# Patient Record
Sex: Female | Born: 1975
Health system: Southern US, Community
[De-identification: ages and names within clinical notes are randomized; demographics above are authoritative.]

## PROBLEM LIST (undated history)

## (undated) DIAGNOSIS — R87629 Unspecified abnormal cytological findings in specimens from vagina: Secondary | ICD-10-CM

## (undated) DIAGNOSIS — T7840XA Allergy, unspecified, initial encounter: Secondary | ICD-10-CM

## (undated) DIAGNOSIS — F419 Anxiety disorder, unspecified: Secondary | ICD-10-CM

## (undated) DIAGNOSIS — F17210 Nicotine dependence, cigarettes, uncomplicated: Secondary | ICD-10-CM

## (undated) HISTORY — DX: Nicotine dependence, cigarettes, uncomplicated: F17.210

## (undated) HISTORY — DX: Anxiety disorder, unspecified: F41.9

## (undated) HISTORY — DX: Allergy, unspecified, initial encounter: T78.40XA

## (undated) HISTORY — DX: Unspecified abnormal cytological findings in specimens from vagina: R87.629

---

## 2001-01-03 ENCOUNTER — Other Ambulatory Visit: Admission: RE | Admit: 2001-01-03 | Discharge: 2001-01-03 | Payer: Self-pay | Admitting: *Deleted

## 2001-01-03 ENCOUNTER — Other Ambulatory Visit: Admission: RE | Admit: 2001-01-03 | Discharge: 2001-01-03 | Payer: Self-pay | Admitting: Obstetrics and Gynecology

## 2001-03-13 ENCOUNTER — Other Ambulatory Visit: Admission: RE | Admit: 2001-03-13 | Discharge: 2001-03-13 | Payer: Self-pay | Admitting: Obstetrics and Gynecology

## 2001-03-13 ENCOUNTER — Encounter (INDEPENDENT_AMBULATORY_CARE_PROVIDER_SITE_OTHER): Payer: Self-pay | Admitting: Specialist

## 2001-07-13 ENCOUNTER — Other Ambulatory Visit: Admission: RE | Admit: 2001-07-13 | Discharge: 2001-07-13 | Payer: Self-pay | Admitting: *Deleted

## 2002-06-12 ENCOUNTER — Encounter: Admission: RE | Admit: 2002-06-12 | Discharge: 2002-06-12 | Payer: Self-pay | Admitting: *Deleted

## 2002-06-12 ENCOUNTER — Encounter (INDEPENDENT_AMBULATORY_CARE_PROVIDER_SITE_OTHER): Payer: Self-pay | Admitting: *Deleted

## 2003-11-14 ENCOUNTER — Encounter: Admission: RE | Admit: 2003-11-14 | Discharge: 2003-11-14 | Payer: Self-pay | Admitting: Obstetrics and Gynecology

## 2004-12-03 ENCOUNTER — Ambulatory Visit: Payer: Self-pay | Admitting: Obstetrics and Gynecology

## 2005-11-11 ENCOUNTER — Encounter: Payer: Self-pay | Admitting: Family Medicine

## 2005-11-11 ENCOUNTER — Ambulatory Visit: Payer: Self-pay | Admitting: Obstetrics and Gynecology

## 2006-11-02 ENCOUNTER — Ambulatory Visit: Payer: Self-pay | Admitting: Gynecology

## 2006-11-02 ENCOUNTER — Encounter (INDEPENDENT_AMBULATORY_CARE_PROVIDER_SITE_OTHER): Payer: Self-pay | Admitting: Gynecology

## 2007-12-15 ENCOUNTER — Encounter: Payer: Self-pay | Admitting: Obstetrics & Gynecology

## 2007-12-15 ENCOUNTER — Ambulatory Visit: Payer: Self-pay | Admitting: Obstetrics & Gynecology

## 2008-12-19 ENCOUNTER — Encounter: Payer: Self-pay | Admitting: Obstetrics & Gynecology

## 2008-12-19 ENCOUNTER — Ambulatory Visit: Payer: Self-pay | Admitting: Obstetrics & Gynecology

## 2008-12-20 ENCOUNTER — Encounter: Payer: Self-pay | Admitting: Obstetrics & Gynecology

## 2008-12-20 LAB — CONVERTED CEMR LAB
HCV Ab: NEGATIVE
Hepatitis B Surface Ag: NEGATIVE
Trich, Wet Prep: NONE SEEN

## 2009-12-31 ENCOUNTER — Ambulatory Visit: Payer: Self-pay | Admitting: Obstetrics & Gynecology

## 2011-01-05 NOTE — Group Therapy Note (Signed)
NAMEANGELYN, Angela [name] NO.:  [id number]   MEDICAL RECORD NO.:  [id number]          PATIENT TYPE:  WOC   LOCATION:  [facility]                   FACILITY:  [facility]   PHYSICIAN:  Elsie Lincoln, MD      DATE OF BIRTH:  [birth year]   DATE OF SERVICE:  12/19/2008                                  CLINIC NOTE   HISTORY OF PRESENT ILLNESS:  The patient is a 35 year old, nulliparous  female who presents for annual Pap smear.  She has been in a monogamous  relation 2-1/2 years but still requested STD testing, both vaginally and  serum.  There is are complaints today except for possible beginnings of  a yeast infection, so we will send a wet prep.  The patient does still  smoke and we strongly advised her to quit.  We also talked about  fertility and that she should probably go ahead and consider starting to  try to get pregnant if she ever plans to have one.  We also discussed  genetic abnormalities, increased __________.   PAST MEDICAL HISTORY:  Anxiety.   PAST SURGICAL HISTORY:  None.   GYN HISTORY:  History of ovarian cyst and fibroid tumors.  No sexually  transmitted diseases or Pap smears that are not normal.   SOCIAL HISTORY:  The patient does smoke.  No drinking or drugs.   ALLERGIES:  CODEINE, LATEX.   MEDICATIONS:  Lexapro.   FAMILY HISTORY:  Negative for familial cancers.   REVIEW OF SYSTEMS:  All negative except vaginal discharge.   PHYSICAL EXAMINATION:  VITAL SIGNS:  Temperature 97.9, pulse 87, blood  pressure 92/64, retake 104/64, weight 110 pounds, height 5 feet 3  inches.  GENERAL:  Well-nourished, well-developed in no apparent stress.  HEENT:  Normocephalic, atraumatic.  Thyroid has no masses.  LUNGS:  Clear to auscultation bilaterally.  HEART:  Regular rate and rhythm.  BREASTS:  No masses, no lymphadenopathy.  No skin changes or nipple  discharge.  ABDOMEN:  Soft, nontender.  No organomegaly.  No hernia.  GENITALIA:  Tanner 5.  Vagina pink,  normal rugae with no abnormal  discharge noted.  PELVIC:  Bladder and urethra nontender.  Cervix closed, nontender,  uterus anteverted, nontender.  Adnexa with  no masses, nontender.  RECTAL:  No hemorrhoids.  EXTREMITIES:  Nontender.  No edema.   ASSESSMENT/PLAN:  A 35 year old, nulliparous female for well __________  exam.  1. Pap smear.  2. GC and Chlamydia wet prep.  3. Hepatitis  B and C, RPR, HIV.  4. Return to clinic in a year.           ______________________________  Elsie Lincoln, MD     KL/MEDQ  D:  12/19/2008  T:  12/19/2008  Job:  [id number]

## 2011-01-05 NOTE — Group Therapy Note (Signed)
NAMEDACEY, MILBERGER NO.:  [id number]   MEDICAL RECORD NO.:  [id number]          PATIENT TYPE:  WOC   LOCATION:  [facility]                   FACILITY:  [facility]   PHYSICIAN:  Johnella Moloney, MD        DATE OF BIRTH:  March 20, 1976   DATE OF SERVICE:  12/15/2007                                  CLINIC NOTE   REASON FOR OFFICE VISIT:  Annual exam with STD screening.   HISTORY OF PRESENT ILLNESS:  This is a 35 year old G0, P0, who presents  for her annual exam and STD screening.  She has no major complaints  today.  Her last menstrual period was on November 22, 2007 and was normal.  She has regular menses with only light bleeding and no pain or heavy  bleeding.  She uses condoms for contraception as she is a smoker and she  desires to consider getting pregnant in the near future.  The patient  does report a mild white discharge which is not irritating or causing  any itching.   REVIEW OF SYSTEMS:  The patient denies fever, chills, recent infection,  chest pain, shortness of breath, abdominal pain, nausea, vomiting,  diarrhea, blood in stool or urine.  Anxiety is well controlled on  Lexapro and the patient denies suicidal or homicidal ideation.  She  denies any physical or sexual abuse.   PAST MEDICAL HISTORY:  Significant only for anxiety which is well  controlled on Lexapro 10 mg p.o. daily prescribed by her primary care  physician at [facility].   CURRENT MEDICATIONS:  Lexapro as above.   ALLERGIES:  1. CODEINE.  2. LATEX.   PAST MEDICAL HISTORY:  Reviewed and unchanged.   SOCIAL HISTORY:  Reviewed and unchanged.   FAMILY HISTORY:  Reviewed and unchanged.   PHYSICAL EXAMINATION:  VITAL SIGNS:  Temperature is 98.4, heart rate 83,  blood pressure 100/71, weight 106.5 pounds, height 5 feet 3 inches,  respiratory rate is 16.  GENERAL:  This is a thin, well nourished, well developed female in no  acute distress.  HEART:  Regular rate and rhythm  with no murmurs, rubs, or gallops.  LUNGS:  Clear to auscultation bilaterally with normal work-of-breathing,  and no wheezes, rales, or rhonchi.  ABDOMEN:  Normoactive bowel sounds.  It is soft, nontender, and  nondistended.  EXTREMITIES:  Have no clubbing, cyanosis, or edema and 2 plus dorsalis  pedis pulses bilaterally.  BREASTS:  Have symmetric breast tissue bilaterally without any skin  changes or nipple discharge.  There are no masses or lesions palpated  and no axillary lymphadenopathy.  GENITOURINARY:  The patient has normal external female genitalia without  lesions.  Vaginal mucosa is pink and moist with moderate, thick, white  discharge and wet prep was obtained but no vaginal lesions.  Cervix was  adequately visualized with a Pederson speculum and without discharge,  lesions, or irritation.  Pap smear was obtained and gonorrhea and  chlamydia cultures will be sent from the thin prep.  On bimanual exam  the patient has a nongravid, nontender uterus in the midline position  and  adnexa are nontender and without masses bilaterally.   PENDING LABORATORY:  Pap smear, gonorrhea and chlamydia cultures, HIV,  RPR, hepatitis B and hepatitis C.   ASSESSMENT/PLAN:  This is a 35 year old gravida 0, para 0, healthy  female who presents for her annual exam with sexually transmitted  disease screening.  1. Annual examination.  A Pap smear, bimanual, and breasts exams      performed and Pap smear is pending.  The patient's last Pap smear      was in May 2008 and normal.  2. Contraceptive counseling.  The patient declines the use of oral      contraceptive pills or a Nuva ring given that she is a smoker and      she has had problems with these in the past.  Options such as Depo-      Provera, Merina, and Implanon were discussed with the patient;      however, she is considering the possibility of getting pregnant in      the near future.  She desires to think about these further.  She       will call the office if she desires any of these options.  I      advised the patient to take a Women's Multivitamin with folic acid      daily in the meantime if she is considering the option of getting      pregnant and to continue using lambskin condoms.  3. Sexually transmitted disease testing.  The patient has no known      exposures, however, was screened at her request for gonorrhea,      chlamydia, human immunodeficiency virus, syphilis, hepatitis B and      C.  Results are pending.  4. Anxiety.  The patient is currently stable on Lexapro 10 mg by mouth      daily as prescribed by her primary care physician at [facility].     ______________________________  Dr. Drue Dun    ______________________________  Johnella Moloney, MD    Hadley Pen  D:  12/15/2007  T:  12/15/2007  Job:  [id number]

## 2011-01-08 NOTE — Group Therapy Note (Signed)
NAME:  [name], Angela                        ACCOUNT NO.:  [id number]   MEDICAL RECORD NO.:  [id number]                   PATIENT TYPE:  OUT   LOCATION:  [facility]                           FACILITY:  [facility]   PHYSICIAN:  Argentina Donovan, MD                     DATE OF BIRTH:  [birth year]   DATE OF SERVICE:  11/14/2003                                    CLINIC NOTE   REASON FOR VISIT:  The patient is a 35 year old nulligravida white female  with a history of CIN-1 whose last Pap smear in December 2003 was normal.  She is in for a Pap smear today and we also discussed contraception.  She  had a bad reaction to oral contraceptives many years ago and is resistant to  taking those.  We have discussed the other alternatives and the patient is  going to be tried on the NuvaRing.  She also has a LATEX allergy and is  allergic to CODEINE.  The external genitalia was normal, BUS within normal  limits.  The vagina was clean and well rugated.  The cervix was nulliparous  and clean.  The uterus is anterior, of normal size, shape consistency, with  normal adnexa and a free cul-de-sac.  Abdomen was soft, flat, nontender, no  masses nor organomegaly.                                               Argentina Donovan, MD    PR/MEDQ  D:  11/14/2003  T:  11/14/2003  Job:  [id number]

## 2011-01-14 ENCOUNTER — Ambulatory Visit (INDEPENDENT_AMBULATORY_CARE_PROVIDER_SITE_OTHER): Payer: BC Managed Care – PPO | Admitting: Obstetrics and Gynecology

## 2011-01-14 ENCOUNTER — Other Ambulatory Visit: Payer: Self-pay | Admitting: Obstetrics and Gynecology

## 2011-01-14 DIAGNOSIS — Z01419 Encounter for gynecological examination (general) (routine) without abnormal findings: Secondary | ICD-10-CM

## 2011-01-14 DIAGNOSIS — Z124 Encounter for screening for malignant neoplasm of cervix: Secondary | ICD-10-CM

## 2011-01-15 NOTE — Group Therapy Note (Signed)
NAMEBRADI, ARBUTHNOT NO.:  [id number]  MEDICAL RECORD NO.:  [id number]           PATIENT TYPE:  A  LOCATION:  [facility]                   FACILITY:  [facility]  PHYSICIAN:  Argentina Donovan, MD        DATE OF BIRTH:  1976/04/23  DATE OF SERVICE:  01/14/2011                                 CLINIC NOTE  HISTORY OF PRESENT ILLNESS:  The patient is a 35 year old Caucasian female with no complaints, currently uses condoms for contraception.  We have talked about contraception and I gave her some information written on the Mirena IUD since she is a smoker.  She has allergies to LATEX and CODEINE.  She has had normal Pap smears the last three, I have told her that we look out every 3 years now.  PHYSICAL EXAMINATION:  VITAL SIGNS:  Her blood pressure is 99/65, her weight is 112, and she is 5 feet 3 inches tall. GENERAL:  Well-developed, well-nourished white female in no acute distress. NECK:  Thyroid symmetrical.  No dominant masses. BREASTS:  Symmetrical.  No dominant masses.  No nipple discharge.  No supraclavicular or axillary nodes. ABDOMEN:  Soft, flat, and nontender.  No masses or organomegaly. GENITALIA:  External is normal.  BUS is within normal limits.  Vagina is clean and well rugated.  The cervix is clean and nulliparous.  Uterus is anterior of normal size, shape, and consistency.  The adnexa is normal.  IMPRESSION:  Normal gynecological examination.  The patient to consider Mirena IUD.  Chlamydia and gonorrhea testing will be done after the Pap smear and the patient is having blood tests for hepatitis, Human immunodeficiency virus, and syphilis.          ______________________________ Argentina Donovan, MD    PR/MEDQ  D:  01/14/2011  T:  01/15/2011  Job:  [id number]

## 2011-03-15 ENCOUNTER — Telehealth: Payer: Self-pay | Admitting: *Deleted

## 2011-03-15 NOTE — Telephone Encounter (Signed)
Spoke w/pt. She wanted to know when to start the Loestrin 24 pills. Her LMP was 03/05/11.  I told pt to start on the first Sunday after her next peroid begins. She asked if she can start the pills on the Manual Navarra her period begins. I told her yes.  Pt had no further questions.

## 2011-03-15 NOTE — Telephone Encounter (Signed)
Pt stated she has gotten med filled but is unsure about how and when to start. She said a message could be left on her voice mail.

## 2011-03-16 ENCOUNTER — Telehealth: Payer: Self-pay | Admitting: *Deleted

## 2011-03-16 NOTE — Telephone Encounter (Signed)
No call- encounter started by mistake

## 2011-05-17 ENCOUNTER — Telehealth: Payer: Self-pay | Admitting: *Deleted

## 2011-05-17 NOTE — Telephone Encounter (Signed)
Pt left message stating that she has a question about her Lo estrin and she is having spotting.

## 2011-05-19 NOTE — Telephone Encounter (Signed)
Called pt- left message that I was returning her call to answer her question and discuss her concern.

## 2011-05-19 NOTE — Telephone Encounter (Signed)
Spoke w/pt. She states she is on her second pack of Loestrin. She had some light breakthrough bleeding mid-cycle during the first pack which lasted a couple days. Now during this second pack she has been bleeding for 1 wk and it is heavier than last month. She is currently in the third week of pills of the pack. She wants to know if this is normal or expected. I stated that it is common for your body to take several months to be accustomed to the pills but that the breakthrough bleeding should not increase. I will send a message to Dr. Okey Dupre to see if he would like to change her Rx. We will call her back next week after hearing from Dr. Okey Dupre. Pt voiced understanding.

## 2011-05-25 NOTE — Telephone Encounter (Signed)
Pt left new message checking to see what Dr. Okey Dupre wanted to do about her bleeding. I called her back and learned that the bleeding has become much less- only occasional spotting. I told pt that I have not  heard back from Dr. Okey Dupre as of yet. I will be able to speak to him tomorrow afternoon. Pt voiced understanding.

## 2011-05-26 NOTE — Telephone Encounter (Signed)
Called pt after speaking w/Dr. Okey Dupre.  I informed her that he said to continue her pills as prescribed. If she has bleeding mid-cycle during the next pack which is heavier that spotting, she should call for follow up appt as we may need to change her Rx. Pt voiced understanding.

## 2011-06-09 ENCOUNTER — Telehealth: Payer: Self-pay | Admitting: *Deleted

## 2011-06-09 NOTE — Telephone Encounter (Signed)
Pt left message stating that she received my message and is comfortable with that plan of care. She will call back if next period is not normal or if she has further questions.

## 2011-06-09 NOTE — Telephone Encounter (Signed)
Pt left message stating that she did not have her normal cycle with the last pack of pills (Loestrin).  She is 10 days into the next pack and still has not had a period. She would like a call back. I returned her call and left message on her personal voice mail. I stated that since last month (9/24)  she had an unusually heavy and long lasting period, this is probably just her body becoming used to the hormones. She should continue to take the pills according to schedule. If she does not get a period with this pack or if the period is unusually heavy, she should call back for follow up appt. Also she may call back if she has further questions.

## 2011-07-22 ENCOUNTER — Telehealth: Payer: Self-pay | Admitting: *Deleted

## 2011-07-22 NOTE — Telephone Encounter (Signed)
Pt left message stating that she has not had a cycle this month. She has been taking Loestrin without interruption and has 3 pills left of the current pack. She has checked a home UPT and it is negative. She would like to speak to a nurse before starting the next pack of pills.

## 2011-07-23 NOTE — Telephone Encounter (Signed)
Spoke w/pt and discussed her concerns. She is now on her 3rd or 4th pack of Lo estrin and has not had a full period since beginning the medication. She reports that she has had some spotting @ times. She is due to start her next pack of pills in 2 days and wants to know what to do. I advised pt to continue the Lo estrin and we will get her an appt for follow up before the pack is finished. I stated that it is ok for her not to have a period for several months but the doctor may not want her to stay on these pills indefinitely. Since the pt has not been seen since starting the OCP's, I asked if she has had her BP checked. She stated that she was seen by another provider recently due to illness and her BP was about the same as on her last clinic visit (99/65). Pt will be contacted with appt once it is scheduled. Pt voiced understanding.

## 2011-08-06 ENCOUNTER — Encounter: Payer: Self-pay | Admitting: Physician Assistant

## 2011-08-06 ENCOUNTER — Ambulatory Visit (INDEPENDENT_AMBULATORY_CARE_PROVIDER_SITE_OTHER): Payer: BC Managed Care – PPO | Admitting: Physician Assistant

## 2011-08-06 VITALS — BP 104/71 | HR 72 | Temp 98.6°F | Ht 63.0 in | Wt 117.7 lb

## 2011-08-06 DIAGNOSIS — F17210 Nicotine dependence, cigarettes, uncomplicated: Secondary | ICD-10-CM

## 2011-08-06 DIAGNOSIS — F17200 Nicotine dependence, unspecified, uncomplicated: Secondary | ICD-10-CM

## 2011-08-06 DIAGNOSIS — N920 Excessive and frequent menstruation with regular cycle: Secondary | ICD-10-CM

## 2011-08-06 HISTORY — DX: Nicotine dependence, cigarettes, uncomplicated: F17.210

## 2011-08-06 NOTE — Patient Instructions (Signed)
Smoking Cessation This document explains the best ways for you to quit smoking and new treatments to help. It lists new medicines that can double or triple your chances of quitting and quitting for good. It also considers ways to avoid relapses and concerns you may have about quitting, including weight gain. NICOTINE: A POWERFUL ADDICTION If you have tried to quit smoking, you know how hard it can be. It is hard because nicotine is a very addictive drug. For some people, it can be as addictive as heroin or cocaine. Usually, people make 2 or 3 tries, or more, before finally being able to quit. Each time you try to quit, you can learn about what helps and what hurts. Quitting takes hard work and a lot of effort, but you can quit smoking. QUITTING SMOKING IS ONE OF THE MOST IMPORTANT THINGS YOU WILL EVER DO.  You will live longer, feel better, and live better.   The impact on your body of quitting smoking is felt almost immediately:   Within 20 minutes, blood pressure decreases. Pulse returns to its normal level.   After 8 hours, carbon monoxide levels in the blood return to normal. Oxygen level increases.   After 24 hours, chance of heart attack starts to decrease. Breath, hair, and body stop smelling like smoke.   After 48 hours, damaged nerve endings begin to recover. Sense of taste and smell improve.   After 72 hours, the body is virtually free of nicotine. Bronchial tubes relax and breathing becomes easier.   After 2 to 12 weeks, lungs can hold more air. Exercise becomes easier and circulation improves.   Quitting will reduce your risk of having a heart attack, stroke, cancer, or lung disease:   After 1 year, the risk of coronary heart disease is cut in half.   After 5 years, the risk of stroke falls to the same as a nonsmoker.   After 10 years, the risk of lung cancer is cut in half and the risk of other cancers decreases significantly.   After 15 years, the risk of coronary heart  disease drops, usually to the level of a nonsmoker.   If you are pregnant, quitting smoking will improve your chances of having a healthy baby.   The people you live with, especially your children, will be healthier.   You will have extra money to spend on things other than cigarettes.  FIVE KEYS TO QUITTING Studies have shown that these 5 steps will help you quit smoking and quit for good. You have the best chances of quitting if you use them together: 1. Get ready.  2. Get support and encouragement.  3. Learn new skills and behaviors.  4. Get medicine to reduce your nicotine addiction and use it correctly.  5. Be prepared for relapse or difficult situations. Be determined to continue trying to quit, even if you do not succeed at first.  1. GET READY  Set a quit date.   Change your environment.   Get rid of ALL cigarettes, ashtrays, matches, and lighters in your home, car, and place of work.   Do not let people smoke in your home.   Review your past attempts to quit. Think about what worked and what did not.   Once you quit, do not smoke. NOT EVEN A PUFF!  2. GET SUPPORT AND ENCOURAGEMENT Studies have shown that you have a better chance of being successful if you have help. You can get support in many ways.  Tell   your family, friends, and coworkers that you are going to quit and need their support. Ask them not to smoke around you.   Talk to your caregivers (doctor, dentist, nurse, pharmacist, psychologist, and/or smoking counselor).   Get individual, group, or telephone counseling and support. The more counseling you have, the better your chances are of quitting. Programs are available at local hospitals and health centers. Call your local health department for information about programs in your area.   Spiritual beliefs and practices may help some smokers quit.   Quit meters are small computer programs online or downloadable that keep track of quit statistics, such as amount  of "quit-time," cigarettes not smoked, and money saved.   Many smokers find one or more of the many self-help books available useful in helping them quit and stay off tobacco.  3. LEARN NEW SKILLS AND BEHAVIORS  Try to distract yourself from urges to smoke. Talk to someone, go for a walk, or occupy your time with a task.   When you first try to quit, change your routine. Take a different route to work. Drink tea instead of coffee. Eat breakfast in a different place.   Do something to reduce your stress. Take a hot bath, exercise, or read a book.   Plan something enjoyable to do every day. Reward yourself for not smoking.   Explore interactive web-based programs that specialize in helping you quit.  4. GET MEDICINE AND USE IT CORRECTLY Medicines can help you stop smoking and decrease the urge to smoke. Combining medicine with the above behavioral methods and support can quadruple your chances of successfully quitting smoking. The U[place]. Food and Drug Administration (FDA) has approved 7 medicines to help you quit smoking. These medicines fall into 3 categories.  Nicotine replacement therapy (delivers nicotine to your body without the negative effects and risks of smoking):   Nicotine gum: Available over-the-counter.   Nicotine lozenges: Available over-the-counter.   Nicotine inhaler: Available by prescription.   Nicotine nasal spray: Available by prescription.   Nicotine skin patches (transdermal): Available by prescription and over-the-counter.   Antidepressant medicine (helps people abstain from smoking, but how this works is unknown):   Bupropion sustained-release (SR) tablets: Available by prescription.   Nicotinic receptor partial agonist (simulates the effect of nicotine in your brain):   Varenicline tartrate tablets: Available by prescription.   Ask your caregiver for advice about which medicines to use and how to use them. Carefully read the information on the package.    Everyone who is trying to quit may benefit from using a medicine. If you are pregnant or trying to become pregnant, nursing an infant, you are under age 18, or you smoke fewer than 10 cigarettes per day, talk to your caregiver before taking any nicotine replacement medicines.   You should stop using a nicotine replacement product and call your caregiver if you experience nausea, dizziness, weakness, vomiting, fast or irregular heartbeat, mouth problems with the lozenge or gum, or redness or swelling of the skin around the patch that does not go away.   Do not use any other product containing nicotine while using a nicotine replacement product.   Talk to your caregiver before using these products if you have diabetes, heart disease, asthma, stomach ulcers, you had a recent heart attack, you have high blood pressure that is not controlled with medicine, a history of irregular heartbeat, or you have been prescribed medicine to help you quit smoking.  5. BE PREPARED FOR RELAPSE OR   DIFFICULT SITUATIONS  Most relapses occur within the first 3 months after quitting. Do not be discouraged if you start smoking again. Remember, most people try several times before they finally quit.   You may have symptoms of withdrawal because your body is used to nicotine. You may crave cigarettes, be irritable, feel very hungry, cough often, get headaches, or have difficulty concentrating.   The withdrawal symptoms are only temporary. They are strongest when you first quit, but they will go away within 10 to 14 days.  Here are some difficult situations to watch for:  Alcohol. Avoid drinking alcohol. Drinking lowers your chances of successfully quitting.   Caffeine. Try to reduce the amount of caffeine you consume. It also lowers your chances of successfully quitting.   Other smokers. Being around smoking can make you want to smoke. Avoid smokers.   Weight gain. Many smokers will gain weight when they quit, usually  less than 10 pounds. Eat a healthy diet and stay active. Do not let weight gain distract you from your main goal, quitting smoking. Some medicines that help you quit smoking may also help delay weight gain. You can always lose the weight gained after you quit.   Bad mood or depression. There are a lot of ways to improve your mood other than smoking.  If you are having problems with any of these situations, talk to your caregiver. SPECIAL SITUATIONS AND CONDITIONS Studies suggest that everyone can quit smoking. Your situation or condition can give you a special reason to quit.  Pregnant women/new mothers: By quitting, you protect your baby's health and your own.   Hospitalized patients: By quitting, you reduce health problems and help healing.   Heart attack patients: By quitting, you reduce your risk of a second heart attack.   Lung, head, and neck cancer patients: By quitting, you reduce your chance of a second cancer.   Parents of children and adolescents: By quitting, you protect your children from illnesses caused by secondhand smoke.  QUESTIONS TO THINK ABOUT Think about the following questions before you try to stop smoking. You may want to talk about your answers with your caregiver.  Why do you want to quit?   If you tried to quit in the past, what helped and what did not?   What will be the most difficult situations for you after you quit? How will you plan to handle them?   Who can help you through the tough times? Your family? Friends? Caregiver?   What pleasures do you get from smoking? What ways can you still get pleasure if you quit?  Here are some questions to ask your caregiver:  How can you help me to be successful at quitting?   What medicine do you think would be best for me and how should I take it?   What should I do if I need more help?   What is smoking withdrawal like? How can I get information on withdrawal?  Quitting takes hard work and a lot of effort,  but you can quit smoking. FOR MORE INFORMATION  Smokefree.gov (http://www.smokefree.gov) provides free, accurate, evidence-based information and professional assistance to help support the immediate and long-term needs of people trying to quit smoking. Document Released: 08/03/2001 Document Revised: 04/21/2011 Document Reviewed: 05/26/2009 ExitCare Patient Information 2012 [employer]. 

## 2011-08-06 NOTE — Progress Notes (Signed)
Chief Complaint:  Follow-up   Angela [name] is  35 y.o. No obstetric history on file..  Patient's last menstrual period was 07/19/2011..  She presents complaining of Follow-up  States irregular bleeding since starting OCPs. Describes missing periods and having breakthrough spotting mid-cycle. Using OCPs for contraception, states periods regular, light, and short when not on hormones. Undesired fertility at this time, unsure of desire for future fertility at this time.  Denies lower extremity pain or swelling, chest pain, abd pain, dysuria, dyspareunia.   Obstetrical/Gynecological History: OB History    Grav Para Term Preterm Abortions TAB SAB Ect Mult Living                  Past Medical History: Past Medical History  Diagnosis Date  . Anxiety     on lexapro  . Allergy     latex and codeine  . Heavy smoker (more than 20 cigarettes per day) 08/06/2011    Past Surgical History: History reviewed. No pertinent past surgical history.  Family History: Family History  Problem Relation Age of Onset  . Cancer Maternal Grandmother     throat  . Cancer Maternal Grandfather     prostate    Social History: History  Substance Use Topics  . Smoking status: Current Everyday Smoker -- 1.0 packs/day for 15 years    Types: Cigarettes  . Smokeless tobacco: Never Used  . Alcohol Use: Yes     rarely    Allergies:  Allergies  Allergen Reactions  . Latex   . Codeine       Review of Systems - Negative except what has been reviewed in the HPI  Physical Exam   Blood pressure 104/71, pulse 72, temperature 98.6 F (37 C), temperature source Oral, height 5\' 3"  (1.6 m), weight 53.388 kg (117 lb 11.2 oz), last menstrual period 07/19/2011.  General: General appearance - alert, well appearing, and in no distress and oriented to person, place, and time Mental status - alert, oriented to person, place, and time, normal mood, behavior, speech, dress, motor activity, and thought  processes, affect appropriate to mood Focused Gynecological Exam: examination not indicated   Assessment: Contraceptive Management  Patient Active Problem List  Diagnoses  . Heavy smoker (more than 20 cigarettes per day)    Plan: Discussed with patient contraindication of OCPs given 1ppd smoker, will not refill OCP rx. Review options for non hormonal and progestin only contraception.   Smoking cessation strongly encouraged  Condoms/spermicide recommended in meantime.    Bethenny Losee E. 08/06/2011,9:42 AM

## 2011-09-14 ENCOUNTER — Telehealth: Payer: Self-pay | Admitting: *Deleted

## 2011-09-14 NOTE — Telephone Encounter (Signed)
Pt left message to give update on her status as requested by Maylon Cos. She states that she has finally had a normal period after 5 packs of pills. She does not want IUD @ this time since she has had a normal menstrual cycle. Report of call routed to [facility] for review.

## 2011-09-17 MED ORDER — NORETHINDRONE 0.35 MG PO TABS: 1.0000 | ORAL_TABLET | Freq: Every day | ORAL | Status: DC

## 2011-09-17 NOTE — Telephone Encounter (Signed)
Received call from Rosalita Chessman in response to the message I had sent.  She asked me to call pt and remind her of the strong recommendation that she not continue to take OCP's due to her age and smoking status. Pt voiced understanding of the risks involved. She stated that she does not want the Mirena IUD @ this time. She asked if she can take the OCP which does not contain estrogen. I called Maylon Cos and Rx for Micronor was approved.  Pt was informed and will begin new Rx on 09/19/11. Pt was also advised that Rosalita Chessman wants her to have follow up in 6 mos. Pt voiced understanding of all information and instructions.

## 2012-01-13 ENCOUNTER — Telehealth: Payer: Self-pay | Admitting: *Deleted

## 2012-01-13 NOTE — Telephone Encounter (Signed)
Patient called stating wanted to know if she needed to have a follow up appointment and discuss birth control pills. Patient also states having brown discharge with odor.

## 2012-01-13 NOTE — Telephone Encounter (Signed)
Called Angela [name], states she is on [name] birth control pill and it is causing her period to be prolonged-with prolonged bleeding for 2 weeks. Also c/o brown vaginal discharge with odor and states she is not sure if it is period type bleeding or vaginal discharge.  Discussed should have it evaluated and can disuss changing pill also, also wants urine checked. Transferred  To front desk to make appointments

## 2012-01-14 ENCOUNTER — Encounter: Payer: Self-pay | Admitting: Physician Assistant

## 2012-01-14 ENCOUNTER — Ambulatory Visit (INDEPENDENT_AMBULATORY_CARE_PROVIDER_SITE_OTHER): Payer: BC Managed Care – PPO | Admitting: Physician Assistant

## 2012-01-14 VITALS — BP 103/72 | HR 81 | Temp 97.9°F | Resp 12 | Ht 63.0 in | Wt 114.4 lb

## 2012-01-14 DIAGNOSIS — N760 Acute vaginitis: Secondary | ICD-10-CM

## 2012-01-14 DIAGNOSIS — R300 Dysuria: Secondary | ICD-10-CM

## 2012-01-14 LAB — POCT URINALYSIS DIP (DEVICE)
Ketones, ur: NEGATIVE mg/dL
Leukocytes, UA: NEGATIVE
Protein, ur: NEGATIVE mg/dL
Specific Gravity, Urine: 1.01 (ref 1.005–1.030)
pH: 5.5 (ref 5.0–8.0)

## 2012-01-14 LAB — WET PREP, GENITAL: Trich, Wet Prep: NONE SEEN

## 2012-01-14 MED ORDER — TINIDAZOLE 500 MG PO TABS: 2.0000 g | ORAL_TABLET | Freq: Every day | ORAL | Status: AC

## 2012-01-14 NOTE — Patient Instructions (Signed)
Vaginitis Vaginitis is an infection. It causes soreness, swelling, and redness (inflammation) of the vagina. Many of these infections are sexually transmitted diseases (STDs). Having unprotected sex can cause further problems and complications such as:  Chronic pelvic pain.   Infertility.   Unwanted pregnancy.   Abortion.   Tubal pregnancy.   Infection passed on to the newborn.   Cancer.  CAUSES   Monilia. This is a yeast or fungus infection, not an STD.   Bacterial vaginosis. The normal balance of bacteria in the vagina is disrupted and is replaced by an overgrowth of certain bacteria.   Gonorrhea, chlamydia. These are bacterial infections that are STDs.   Vaginal sponges, diaphragms, and intrauterine devices.   Trichomoniasis. This is a STD infection caused by a parasite.   Viruses like herpes and human papillomavirus. Both are STDs.   Pregnancy.   Immunosuppression. This occurs with certain conditions such as HIV infection or cancer.   Using bubble bath.   Taking certain antibiotic medicines.   Sporadic recurrence can occur if you become sick.   Diabetes.   Steroids.   Allergic reaction. If you have an allergy to:   Douches.   Soaps.   Spermicides.   Condoms.   Scented tampons or vaginal sprays.  SYMPTOMS   Abnormal vaginal discharge.   Itching of the vagina.   Pain in the vagina.   Swelling of the vagina.  In some cases, there are no symptoms. TREATMENT  Treatment will vary depending on the type of infection.  Bacteria or trichomonas are usually treated with oral antibiotics and sometimes vaginal cream or suppositories.   Monilia vaginitis is usually treated with vaginal creams, suppositories, or oral antifungal pills.   Viral vaginitis has no cure. However, the symptoms of herpes (a viral vaginitis) can be treated to relieve the discomfort. Human papillomavirus has no symptoms. However, there are treatments for the diseases caused by human  papillomavirus.   With allergic vaginitis, you need to stop using the product that is causing the problem. Vaginal creams can be used to treat the symptoms.   When treating an STD, the sex partner should also be treated.  HOME CARE INSTRUCTIONS   Take all the medicines as directed by your caregiver.   Do not use scented tampons, soaps, or vaginal sprays.   Do not douche.   Tell your sex partner if you have a vaginal infection or an STD.   Do not have sexual intercourse until you have treated the vaginitis.   Practice safe sex by using condoms.  SEEK MEDICAL CARE IF:   You have abdominal pain.   Your symptoms get worse during treatment.  Document Released: 06/06/2007 Document Revised: 07/29/2011 Document Reviewed: 01/30/2009 ExitCare Patient Information 2012 [employer]. 

## 2012-01-14 NOTE — Progress Notes (Signed)
Chief Complaint:  Vaginal Discharge and Contraception   Angela [name] is  36 y.o. No obstetric history on file..  Patient's last menstrual period was 12/16/2011..  She presents complaining of Vaginal Discharge and Contraception  White/brown discharge with odor, worse after intercourse for several weeks.  Obstetrical/Gynecological History: OB History    Grav Para Term Preterm Abortions TAB SAB Ect Mult Living                  Past Medical History: Past Medical History  Diagnosis Date  . Anxiety     on lexapro  . Allergy     latex and codeine  . Heavy smoker (more than 20 cigarettes per day) 08/06/2011    Past Surgical History: No past surgical history on file.  Family History: Family History  Problem Relation Age of Onset  . Cancer Maternal Grandmother     throat  . Cancer Maternal Grandfather     prostate    Social History: History  Substance Use Topics  . Smoking status: Current Everyday Smoker -- 1.0 packs/day for 15 years    Types: Cigarettes  . Smokeless tobacco: Never Used  . Alcohol Use: Yes     rarely    Allergies:  Allergies  Allergen Reactions  . Latex   . Codeine      (Not in a hospital admission)  Review of Systems - Negative except what has been reviewed in HPI  Physical Exam   Blood pressure 103/72, pulse 81, temperature 97.9 F (36.6 C), temperature source Oral, resp. rate 12, height 5\' 3"  (1.6 m), weight 114 lb 6.4 oz (51.891 kg), last menstrual period 12/16/2011.  General: General appearance - alert, well appearing, and in no distress and oriented to person, place, and time Mental status - alert, oriented to person, place, and time, normal mood, behavior, speech, dress, motor activity, and thought processes, affect appropriate to mood Abdomen - soft, nontender, nondistended, no masses or organomegaly Focused Gynecological Exam: VULVA: normal appearing vulva with no masses, tenderness or lesions, VAGINA: retained tampon, malodorous  removed from vault, CERVIX: normal appearing cervix without discharge or lesions, UTERUS: uterus is normal size, shape, consistency and nontender, ADNEXA: normal adnexa in size, nontender and no masses  Labs: Recent Results (from the past 24 hour(s))  POCT URINALYSIS DIP (DEVICE)   Collection Time   01/14/12  9:40 AM      Component Value Range   Glucose, UA NEGATIVE  NEGATIVE (mg/dL)   Bilirubin Urine NEGATIVE  NEGATIVE    Ketones, ur NEGATIVE  NEGATIVE (mg/dL)   Specific Gravity, Urine 1.010  1.005 - 1.030    Hgb urine dipstick TRACE (*) NEGATIVE    pH 5.5  5.0 - 8.0    Protein, ur NEGATIVE  NEGATIVE (mg/dL)   Urobilinogen, UA 0.2  0.0 - 1.0 (mg/dL)   Nitrite NEGATIVE  NEGATIVE    Leukocytes, UA NEGATIVE  NEGATIVE     Assessment: Retained tampon Vaginitis  Plan: Will send rx for tindamax Urine culture pending  Angela [name] E. 01/14/2012,10:02 AM

## 2012-01-20 ENCOUNTER — Telehealth: Payer: Self-pay | Admitting: *Deleted

## 2012-01-20 NOTE — Telephone Encounter (Signed)
Received a call transferred from front desk, patient requesting urine culture results. Informed patient urine culture negative.. No other concerns voiced

## 2012-04-01 ENCOUNTER — Other Ambulatory Visit: Payer: Self-pay | Admitting: Physician Assistant

## 2012-04-03 ENCOUNTER — Other Ambulatory Visit: Payer: Self-pay | Admitting: Medical

## 2012-04-03 DIAGNOSIS — Z3041 Encounter for surveillance of contraceptive pills: Secondary | ICD-10-CM

## 2012-04-03 MED ORDER — NORETHINDRONE 0.35 MG PO TABS: 1.0000 | ORAL_TABLET | Freq: Every day | ORAL | Status: DC

## 2012-04-03 NOTE — Telephone Encounter (Signed)
Pt called requesting refill for OCPs be sent to [facility] pharmacy on [facility] [address] [facility] [address]. Per protocol 3 months of OCPs will be sent to her pharmacy. Order put in and routed to Maylon Cos, CNM for signature.

## 2012-04-25 ENCOUNTER — Telehealth: Payer: Self-pay | Admitting: Medical

## 2012-04-25 NOTE — Telephone Encounter (Signed)
Patient called stating that she is on OCPs and would like to be seen before her next cycle starts around the 7th. She is experiencing a d/c with odor similar to previous d/c. Would like to talk to someone and hopefully come in soon.

## 2012-04-25 NOTE — Telephone Encounter (Signed)
LM for patient to return call to clinic to discuss her earlier message. Patient is describing BV symptoms, but has been treated within the last 6 months so she will need an appointment. She wants to come in to talk to a provider about her cycles anyway. Next available GYN appointment is not until 05/05/12. Will need to ask Tresa Endo to [facility] depending on when patient is available.

## 2012-04-25 NOTE — Telephone Encounter (Signed)
Patient returned call to clinic. Discussed bleeding and d/c concerns. Patient is experiencing long, heavy periods on the Erin OCPs and has had a brown foul smelling discharge recently. I have made her an appointment to come in on Friday to be seen in the clinic. The patient plans to keep this appointment. She voiced understanding and no further questions.

## 2012-04-28 ENCOUNTER — Ambulatory Visit (INDEPENDENT_AMBULATORY_CARE_PROVIDER_SITE_OTHER): Payer: BC Managed Care – PPO | Admitting: Obstetrics & Gynecology

## 2012-04-28 ENCOUNTER — Encounter: Payer: Self-pay | Admitting: Obstetrics & Gynecology

## 2012-04-28 VITALS — BP 108/73 | HR 89 | Temp 97.2°F | Ht 63.0 in | Wt 116.6 lb

## 2012-04-28 DIAGNOSIS — N760 Acute vaginitis: Secondary | ICD-10-CM

## 2012-04-28 DIAGNOSIS — A499 Bacterial infection, unspecified: Secondary | ICD-10-CM

## 2012-04-28 DIAGNOSIS — N939 Abnormal uterine and vaginal bleeding, unspecified: Secondary | ICD-10-CM

## 2012-04-28 DIAGNOSIS — N898 Other specified noninflammatory disorders of vagina: Secondary | ICD-10-CM

## 2012-04-28 NOTE — Progress Notes (Signed)
Subjective:     Patient ID: ODENA MCQUAID, female   DOB: [birth year], 36 y.o.   MRN: [id number]  HPI  Pt on progesterone only contraception.  She c/o cont'd spotting. She feels like she always has an odor since she had a retained tampon recently.  Uses powder, medicated wipes, occ douching and scented soap.     Review of Systems n/c     Objective:   Physical ExamBP 108/73  Pulse 89  Temp 97.2 F (36.2 C) (Oral)  Ht 5\' 3"  (1.6 m)  Wt 116 lb 9.6 oz (52.889 kg)  BMI 20.65 kg/m2  LMP 03/29/2012  GU: EGBUS: no lesions noted  Vagina: small amount of  blood in vault Cervix: no lesion; no mucopurulent d/c  Current outpatient prescriptions:escitalopram (LEXAPRO) 10 MG tablet, Take 10 mg by mouth daily.  , Disp: , Rfl: ;  norethindrone (ORTHO MICRONOR) 0.35 MG tablet, Take 1 tablet (0.35 mg total) by mouth daily., Disp: 1 Package, Rfl: 3     Assessment:     Abnormal uterine bleeding on Progesterone only pills.  D/W pt other options for Progesterone only contraception including Nexplanon and Mirena IUS.  Pt does not want to try those options at present.    Wet mount      Plan:     D/w pt bleeding issues with progesterone only productsn/c   Avoid all scented feminine products Avoid douching F/u wet mount F/u prn  Haili Donofrio L. Harraway-Smith, M.D., [facility]

## 2012-04-28 NOTE — Patient Instructions (Addendum)
Bacterial Vaginosis Bacterial vaginosis (BV) is a vaginal infection where the normal balance of bacteria in the vagina is disrupted. The normal balance is then replaced by an overgrowth of certain bacteria. There are several different kinds of bacteria that can cause BV. BV is the most common vaginal infection in women of childbearing age. CAUSES   The cause of BV is not fully understood. BV develops when there is an increase or imbalance of harmful bacteria.   Some activities or behaviors can upset the normal balance of bacteria in the vagina and put women at increased risk including:   Having a new sex partner or multiple sex partners.   Douching.   Using an intrauterine device (IUD) for contraception.   It is not clear what role sexual activity plays in the development of BV. However, women that have never had sexual intercourse are rarely infected with BV.  Women do not get BV from toilet seats, bedding, swimming pools or from touching objects around them.  SYMPTOMS   Grey vaginal discharge.   A fish-like odor with discharge, especially after sexual intercourse.   Itching or burning of the vagina and vulva.   Burning or pain with urination.   Some women have no signs or symptoms at all.  DIAGNOSIS  Your caregiver must examine the vagina for signs of BV. Your caregiver will perform lab tests and look at the sample of vaginal fluid through a microscope. They will look for bacteria and abnormal cells (clue cells), a pH test higher than 4.5, and a positive amine test all associated with BV.  RISKS AND COMPLICATIONS   Pelvic inflammatory disease (PID).   Infections following gynecology surgery.   Developing HIV.   Developing herpes virus.  TREATMENT  Sometimes BV will clear up without treatment. However, all women with symptoms of BV should be treated to avoid complications, especially if gynecology surgery is planned. Female partners generally do not need to be treated. However,  BV may spread between female sex partners so treatment is helpful in preventing a recurrence of BV.   BV may be treated with antibiotics. The antibiotics come in either pill or vaginal cream forms. Either can be used with nonpregnant or pregnant women, but the recommended dosages differ. These antibiotics are not harmful to the baby.   BV can recur after treatment. If this happens, a second round of antibiotics will often be prescribed.   Treatment is important for pregnant women. If not treated, BV can cause a premature delivery, especially for a pregnant woman who had a premature birth in the past. All pregnant women who have symptoms of BV should be checked and treated.   For chronic reoccurrence of BV, treatment with a type of prescribed gel vaginally twice a week is helpful.  HOME CARE INSTRUCTIONS   Finish all medication as directed by your caregiver.   Do not have sex until treatment is completed.   Tell your sexual partner that you have a vaginal infection. They should see their caregiver and be treated if they have problems, such as a mild rash or itching.   Practice safe sex. Use condoms. Only have 1 sex partner.  PREVENTION  Basic prevention steps can help reduce the risk of upsetting the natural balance of bacteria in the vagina and developing BV:  Do not have sexual intercourse (be abstinent).   Do not douche.   Use all of the medicine prescribed for treatment of BV, even if the signs and symptoms go away.     Tell your sex partner if you have BV. That way, they can be treated, if needed, to prevent reoccurrence.  SEEK MEDICAL CARE IF:   Your symptoms are not improving after 3 days of treatment.   You have increased discharge, pain, or fever.  MAKE SURE YOU:   Understand these instructions.   Will watch your condition.   Will get help right away if you are not doing well or get worse.  FOR MORE INFORMATION  Division of STD Prevention (DSTDP), Centers for Disease  Control and Prevention: www.cdc.gov/std American Social Health Association (ASHA): www.ashastd.org  Document Released: 08/09/2005 Document Revised: 07/29/2011 Document Reviewed: 01/30/2009 ExitCare Patient Information 2012 [employer]. 

## 2012-04-30 MED ORDER — METRONIDAZOLE 500 MG PO TABS: 500.0000 mg | ORAL_TABLET | Freq: Two times a day (BID) | ORAL | Status: AC

## 2012-04-30 NOTE — Addendum Note (Signed)
Addended by: Willodean Rosenthal on: 04/30/2012 09:47 AM   Modules accepted: Orders

## 2012-05-01 ENCOUNTER — Telehealth: Payer: Self-pay | Admitting: *Deleted

## 2012-05-01 NOTE — Telephone Encounter (Signed)
Pt informed of her results

## 2012-05-01 NOTE — Telephone Encounter (Signed)
Message copied by Mannie Stabile on Mon May 01, 2012  9:25 AM ------      Message from: Willodean Rosenthal      Created: Sun Apr 30, 2012  9:40 AM       Please call pt.  Needs tx for BV.  Rx at pharmacy.  Please ask her to take the Rx and continue to follow the instructions given by me in the ofc (avoid all fragrance/scented/medicated products in the genital area) .            Thx,      clh-S

## 2012-05-01 NOTE — Telephone Encounter (Signed)
Called patient and left a message to return our call

## 2012-07-23 ENCOUNTER — Other Ambulatory Visit: Payer: Self-pay | Admitting: Physician Assistant

## 2012-07-25 ENCOUNTER — Other Ambulatory Visit: Payer: Self-pay | Admitting: *Deleted

## 2012-07-25 DIAGNOSIS — Z3041 Encounter for surveillance of contraceptive pills: Secondary | ICD-10-CM

## 2012-07-25 MED ORDER — NORETHINDRONE 0.35 MG PO TABS: 1.0000 | ORAL_TABLET | Freq: Every day | ORAL | Status: DC

## 2013-01-11 ENCOUNTER — Telehealth: Payer: Self-pay | Admitting: Family Medicine

## 2013-01-11 MED ORDER — AZITHROMYCIN 250 MG PO TABS: ORAL_TABLET | ORAL | Status: DC

## 2013-01-11 NOTE — Telephone Encounter (Signed)
zpack rx'd

## 2013-01-11 NOTE — Telephone Encounter (Signed)
Ok to call out z-pack

## 2013-02-19 ENCOUNTER — Telehealth: Payer: Self-pay | Admitting: *Deleted

## 2013-02-19 MED ORDER — ESCITALOPRAM OXALATE 10 MG PO TABS: 10.0000 mg | ORAL_TABLET | Freq: Every day | ORAL | Status: DC

## 2013-02-19 NOTE — Telephone Encounter (Signed)
Generic sent in for pt

## 2013-02-20 ENCOUNTER — Telehealth: Payer: Self-pay | Admitting: *Deleted

## 2013-02-21 ENCOUNTER — Telehealth: Payer: Self-pay | Admitting: Family Medicine

## 2013-02-21 MED ORDER — ESCITALOPRAM OXALATE 20 MG PO TABS: 20.0000 mg | ORAL_TABLET | Freq: Every day | ORAL | Status: DC

## 2013-02-21 NOTE — Telephone Encounter (Signed)
Verified correct dose to 20 mg and sent to pharmacy

## 2013-02-27 NOTE — Telephone Encounter (Signed)
This was corrected 02/21/13

## 2013-04-12 ENCOUNTER — Telehealth: Payer: Self-pay

## 2013-04-12 NOTE — Telephone Encounter (Signed)
Pt called and stated that she is having the same sx that she was seen last year for.  Could she get a call back. Called pt and left message that we are returning her call to please give [place] a call back to the clinics.

## 2013-04-18 NOTE — Telephone Encounter (Signed)
Called Angela [name] and left a message we are returning your call - please call clinic back. Per chart review seen 04/28/12 for irregular bleeding.

## 2013-04-25 ENCOUNTER — Ambulatory Visit (INDEPENDENT_AMBULATORY_CARE_PROVIDER_SITE_OTHER): Payer: BC Managed Care – PPO | Admitting: *Deleted

## 2013-04-25 DIAGNOSIS — B9689 Other specified bacterial agents as the cause of diseases classified elsewhere: Secondary | ICD-10-CM

## 2013-04-25 DIAGNOSIS — A499 Bacterial infection, unspecified: Secondary | ICD-10-CM

## 2013-04-25 DIAGNOSIS — N760 Acute vaginitis: Secondary | ICD-10-CM

## 2013-04-25 DIAGNOSIS — N390 Urinary tract infection, site not specified: Secondary | ICD-10-CM

## 2013-04-25 LAB — POCT URINALYSIS DIP (DEVICE)
Bilirubin Urine: NEGATIVE
Glucose, UA: NEGATIVE mg/dL
Leukocytes, UA: NEGATIVE
Nitrite: NEGATIVE
pH: 5 (ref 5.0–8.0)

## 2013-04-25 MED ORDER — METRONIDAZOLE 500 MG PO TABS: 500.0000 mg | ORAL_TABLET | Freq: Two times a day (BID) | ORAL | Status: DC

## 2013-04-27 LAB — URINE CULTURE: Colony Count: NO GROWTH

## 2013-05-01 ENCOUNTER — Telehealth: Payer: Self-pay | Admitting: *Deleted

## 2013-05-01 NOTE — Telephone Encounter (Addendum)
Pt left message requesting urine test results from last week.  I called pt and left message that her urine culture from last week was negative. She does not have a bladder infection.

## 2013-05-03 ENCOUNTER — Encounter: Payer: Self-pay | Admitting: Physician Assistant

## 2013-05-03 ENCOUNTER — Ambulatory Visit: Payer: Self-pay | Admitting: Physician Assistant

## 2013-05-03 VITALS — BP 106/70 | HR 68 | Temp 98.2°F | Resp 18 | Ht 61.5 in | Wt 114.0 lb

## 2013-05-03 DIAGNOSIS — N760 Acute vaginitis: Secondary | ICD-10-CM

## 2013-05-03 NOTE — Progress Notes (Signed)
Patient ID: Angela [name] MRN: [id number], DOB: [birth year], 37 y.o. Date of Encounter: 05/03/2013, 9:27 AM    Chief Complaint:  Chief Complaint  Patient presents with  . c/o vaginitis    some dischrage and oder  tx by GYN with Flagyl but has not resolved,  gyn did not do pelvic exam     HPI: 37 y.o. year old white female reports that she is currently without insurance because she just started a new job and is waiting for the insurance to "kick in". Since that she recently went to her gynecologist office but says that the office is really busy that day plus the fact she did not have insurance they only did a urinalysis. She had no other testing. She was treated with Flagyl which she has just completed. However ever she is continuing to notice a vaginal odor this lady right after the time of intercourse. She's had just a little minimal discharge. No vaginal itching or irritation.     Home Meds: See attached medication section for any medications that were entered at today's visit. The computer does not put those onto this list.The following list is a list of meds entered prior to today's visit.   Current Outpatient Prescriptions on File Prior to Visit  Medication Sig Dispense Refill  . escitalopram (LEXAPRO) 20 MG tablet Take 1 tablet (20 mg total) by mouth daily.  30 tablet  2  . norethindrone (ORTHO MICRONOR) 0.35 MG tablet Take 1 tablet (0.35 mg total) by mouth daily.  1 Package  12   No current facility-administered medications on file prior to visit.    Allergies:  Allergies  Allergen Reactions  . Latex   . Codeine       Review of Systems: See HPI for pertinent ROS. All other ROS negative.    Physical Exam: Blood pressure 106/70, pulse 68, temperature 98.2 F (36.8 C), temperature source Oral, resp. rate 18, height 5' 1.5" (1.562 m), weight 114 lb (51.71 kg), last menstrual period 04/26/2013., Body mass index is 21.19 kg/(m^2). General: Well-nourished well-developed  white female. Appears in no acute distress. Lungs: Clear bilaterally to auscultation without wheezes, rales, or rhonchi. Breathing is unlabored. Heart: Regular rhythm. No murmurs, rubs, or gallops. Msk:  Strength and tone normal for age. Pelvic exam: External genitalia is normal with no lesions or erythema. Vaginal mucosa is normal with no erythema or lesions. Cervix is normal. There is no discharge present. To the right of the cervix on my exam which is on the patient's left side : Just adjacent to the cervix I can see the tip of a deep brown-colored object. I used some GYN forcep and was able to put out the object. There is an old tampon. His deep brown colored and deep brown colored liquid around the tampon. Very malodorous. Neuro: Alert and oriented X 3. Moves all extremities spontaneously. Gait is normal. CNII-XII grossly in tact. Psych:  Responds to questions appropriately with a normal affect.     ASSESSMENT AND PLAN:  37 y.o. year old female with  1. Vaginitis and vulvovaginitis Originally I was going to order a wet prep and a GC culture. However once we did the exam and found this tampon I really think that this is the cause of her odor.  I really think that the wet prep and GC culture are currently negative. Given that she has no insurance coverage we discussed this and we'll not obtain these tests. She is to go home  and douche to get rid of any residual liquid and Oder that was around the area of the tampon. She will then take probiotics to try to restore natural flora to the area. She will followup if she has any continued signs or symptoms.   [address] [place], [place], [facility] 05/03/2013 9:27 AM

## 2013-08-21 ENCOUNTER — Encounter: Payer: Self-pay | Admitting: Family Medicine

## 2013-08-21 ENCOUNTER — Telehealth: Payer: Self-pay | Admitting: Physician Assistant

## 2013-08-21 MED ORDER — ESCITALOPRAM OXALATE 20 MG PO TABS: 20.0000 mg | ORAL_TABLET | Freq: Every day | ORAL | Status: DC

## 2013-08-21 NOTE — Telephone Encounter (Signed)
Pt is needing a refill on the generic Lexapro  Pharmacy is [facility] on [address]  Call back number is [phone]

## 2013-08-21 NOTE — Telephone Encounter (Signed)
Medication refill for one time only.  Patient needs to be seen.  Letter sent for patient to call and schedule.  ??dismissed??

## 2013-10-24 ENCOUNTER — Other Ambulatory Visit: Payer: Self-pay | Admitting: Physician Assistant

## 2013-10-24 NOTE — Telephone Encounter (Signed)
One refill sent.  Pt has appt next week

## 2013-10-29 ENCOUNTER — Ambulatory Visit (INDEPENDENT_AMBULATORY_CARE_PROVIDER_SITE_OTHER): Payer: 59 | Admitting: Physician Assistant

## 2013-10-29 ENCOUNTER — Encounter: Payer: Self-pay | Admitting: Physician Assistant

## 2013-10-29 VITALS — BP 110/72 | HR 68 | Temp 98.3°F | Resp 18 | Ht 60.75 in | Wt 110.0 lb

## 2013-10-29 DIAGNOSIS — F419 Anxiety disorder, unspecified: Secondary | ICD-10-CM | POA: Insufficient documentation

## 2013-10-29 DIAGNOSIS — F411 Generalized anxiety disorder: Secondary | ICD-10-CM

## 2013-10-29 MED ORDER — ESCITALOPRAM OXALATE 20 MG PO TABS: 20.0000 mg | ORAL_TABLET | Freq: Every day | ORAL | Status: DC

## 2013-10-29 NOTE — Progress Notes (Signed)
    Patient ID: Angela [name] MRN: [id number], DOB: 1976/05/09, 38 y.o. Date of Encounter: 10/29/2013, 9:42 AM    Chief Complaint:  Chief Complaint  Patient presents with  . Medication Refill    lexapro     HPI: 38 y.o. year old white female here for followup regarding her anxiety. She is on Lexapro 20 mg daily for this. She says this is controlling her symptoms very well. However she does not think that we should try decreasing the dose or going off of the medicine. She says that it is working very perfectly for her. Has no adverse effects. No other complaints today.     Home Meds: See attached medication section for any medications that were entered at today's visit. The computer does not put those onto this list.The following list is a list of meds entered prior to today's visit.   This is her first visit here since we have been on Epic. Therefore the computer is not bringing in her medications which were entered today. She is on Lexapro 20 mg 1 by mouth daily. No current outpatient prescriptions on file prior to visit.   No current facility-administered medications on file prior to visit.    Allergies:  Allergies  Allergen Reactions  . Latex   . Codeine       Review of Systems: See HPI for pertinent ROS. All other ROS negative.    Physical Exam: Blood pressure 110/72, pulse 68, temperature 98.3 F (36.8 C), temperature source Oral, resp. rate 18, height 5' 0.75" (1.543 m), weight 110 lb (49.896 kg), last menstrual period 10/15/2013., Body mass index is 20.96 kg/(m^2). General:  WNWD WF. Appears in no acute distress. Neck: Supple. No thyromegaly. No lymphadenopathy. Lungs: Clear bilaterally to auscultation without wheezes, rales, or rhonchi. Breathing is unlabored. Heart: Regular rhythm. No murmurs, rubs, or gallops. Msk:  Strength and tone normal for age. Extremities/Skin: Warm and dry. Neuro: Alert and oriented X 3. Moves all extremities spontaneously. Gait is  normal. CNII-XII grossly in tact. Psych:  Responds to questions appropriately with a normal affect.     ASSESSMENT AND PLAN:  38 y.o. year old female with  1. Anxiety Well controlled. No adverse effects to medication. Continue current treatment of Lexapro 20 mg daily. Refills sent in for #30 with 11 refills.   Murray HodgkinsSigned, Ceaira Ernster Beth [place], [place], [facility] 10/29/2013 9:42 AM

## 2014-02-25 ENCOUNTER — Telehealth: Payer: Self-pay | Admitting: Family Medicine

## 2014-02-25 NOTE — Telephone Encounter (Signed)
Call placed to patient and patient made aware.  

## 2014-02-25 NOTE — Telephone Encounter (Signed)
Yes. That should be fine.

## 2014-02-25 NOTE — Telephone Encounter (Signed)
Message copied by Ricard DillonWILLIS, Kailynne Ferrington B on Mon Feb 25, 2014  9:18 AM ------      Message from: Malvin JohnsBULLINS, SUSAN S      Created: Mon Feb 25, 2014  8:41 AM       [phone]      Patient is calling to see if she can take her diflucan with her lexapro        ------

## 2014-02-28 ENCOUNTER — Ambulatory Visit (INDEPENDENT_AMBULATORY_CARE_PROVIDER_SITE_OTHER): Payer: 59 | Admitting: Physician Assistant

## 2014-02-28 ENCOUNTER — Other Ambulatory Visit: Payer: Self-pay | Admitting: *Deleted

## 2014-02-28 ENCOUNTER — Encounter: Payer: Self-pay | Admitting: Physician Assistant

## 2014-02-28 VITALS — BP 118/70 | HR 64 | Temp 98.0°F | Resp 12 | Ht 61.0 in | Wt 112.0 lb

## 2014-02-28 DIAGNOSIS — R300 Dysuria: Secondary | ICD-10-CM

## 2014-02-28 DIAGNOSIS — R319 Hematuria, unspecified: Secondary | ICD-10-CM

## 2014-02-28 DIAGNOSIS — N390 Urinary tract infection, site not specified: Secondary | ICD-10-CM

## 2014-02-28 LAB — URINALYSIS, ROUTINE W REFLEX MICROSCOPIC
Bilirubin Urine: NEGATIVE
Glucose, UA: NEGATIVE mg/dL
KETONES UR: NEGATIVE mg/dL
NITRITE: NEGATIVE
PH: 6 (ref 5.0–8.0)
Protein, ur: NEGATIVE mg/dL
SPECIFIC GRAVITY, URINE: 1.02 (ref 1.005–1.030)
Urobilinogen, UA: 0.2 mg/dL (ref 0.0–1.0)

## 2014-02-28 LAB — URINALYSIS, MICROSCOPIC ONLY
Casts: NONE SEEN
Crystals: NONE SEEN

## 2014-02-28 MED ORDER — FLUCONAZOLE 150 MG PO TABS: 150.0000 mg | ORAL_TABLET | Freq: Once | ORAL | Status: DC

## 2014-02-28 MED ORDER — NITROFURANTOIN MONOHYD MACRO 100 MG PO CAPS: 100.0000 mg | ORAL_CAPSULE | Freq: Two times a day (BID) | ORAL | Status: DC

## 2014-02-28 NOTE — Progress Notes (Signed)
    Patient ID: Angela [name] MRN: [id number], DOB: 1976-01-23, 38 y.o. Date of Encounter: 02/28/2014, 4:57 PM    Chief Complaint:  Chief Complaint  Patient presents with  . Possible UTI     HPI: 38 y.o. year old white female reports that she's been having some mild dysuria. There is some mild discomfort in the suprapubic area. Also states that on the morning of Tuesday 02/26/14 saw a small trace of blood on the toilet paper when she wiped after urinating. Has had no back pain or fevers or chills.     Home Meds:   Outpatient Prescriptions Prior to Visit  Medication Sig Dispense Refill  . escitalopram (LEXAPRO) 20 MG tablet Take 1 tablet (20 mg total) by mouth daily.  30 tablet  11   No facility-administered medications prior to visit.    Allergies:  Allergies  Allergen Reactions  . Latex   . Codeine       Review of Systems: See HPI for pertinent ROS. All other ROS negative.    Physical Exam: Blood pressure 118/70, pulse 64, temperature 98 F (36.7 C), temperature source Oral, resp. rate 12, height 5\' 1"  (1.549 m), weight 112 lb (50.803 kg)., Body mass index is 21.17 kg/(m^2). General: WNWD WF.  Appears in no acute distress. Lungs: Clear bilaterally to auscultation without wheezes, rales, or rhonchi. Breathing is unlabored. Heart: Regular rhythm. No murmurs, rubs, or gallops. Abdomen: Soft, non-distended with normoactive bowel sounds. No hepatomegaly. No rebound/guarding. No obvious abdominal masses. Minimal tenderness with palpation at the suprapubic area. Msk:  Strength and tone normal for age. No tenderness with percussion of costophrenic angles bilaterally. Extremities/Skin: Warm and dry. Neuro: Alert and oriented X 3. Moves all extremities spontaneously. Gait is normal. CNII-XII grossly in tact. Psych:  Responds to questions appropriately with a normal affect.     ASSESSMENT AND PLAN:  38 y.o. year old female with  1. Urinary tract infection with hematuria,  site unspecified - nitrofurantoin, macrocrystal-monohydrate, (MACROBID) 100 MG capsule; Take 1 capsule (100 mg total) by mouth 2 (two) times daily.  Dispense: 6 capsule; Refill: 0  2. Burning with urination - Urinalysis, Routine w reflex microscopic  Take antibiotic as directed. Followup if symptoms do not resolve after completion of antibiotic.  [address]igned, Alayshia Marini Beth [place], [place], [facility] 02/28/2014 4:57 PM

## 2014-03-29 ENCOUNTER — Ambulatory Visit: Payer: 59 | Admitting: Family Medicine

## 2014-04-03 ENCOUNTER — Ambulatory Visit (INDEPENDENT_AMBULATORY_CARE_PROVIDER_SITE_OTHER): Payer: 59 | Admitting: Family Medicine

## 2014-04-03 ENCOUNTER — Encounter: Payer: Self-pay | Admitting: Family Medicine

## 2014-04-03 VITALS — BP 120/64 | HR 72 | Temp 98.6°F | Resp 14 | Ht 61.0 in | Wt 110.0 lb

## 2014-04-03 DIAGNOSIS — F17200 Nicotine dependence, unspecified, uncomplicated: Secondary | ICD-10-CM

## 2014-04-03 DIAGNOSIS — R3129 Other microscopic hematuria: Secondary | ICD-10-CM

## 2014-04-03 DIAGNOSIS — R3915 Urgency of urination: Secondary | ICD-10-CM

## 2014-04-03 LAB — URINALYSIS, MICROSCOPIC ONLY
Casts: NONE SEEN
Crystals: NONE SEEN

## 2014-04-03 LAB — URINALYSIS, ROUTINE W REFLEX MICROSCOPIC
Bilirubin Urine: NEGATIVE
Glucose, UA: NEGATIVE mg/dL
KETONES UR: NEGATIVE mg/dL
LEUKOCYTES UA: NEGATIVE
NITRITE: NEGATIVE
PH: 5.5 (ref 5.0–8.0)
Protein, ur: NEGATIVE mg/dL
UROBILINOGEN UA: 0.2 mg/dL (ref 0.0–1.0)

## 2014-04-03 LAB — PREGNANCY, URINE: PREG TEST UR: NEGATIVE

## 2014-04-03 NOTE — Progress Notes (Signed)
Patient ID: Angela [name], female   DOB: 09-22-1975, 38 y.o.   MRN: [id number]   Subjective:    Patient ID: Angela [name], female    DOB: 09-22-1975, 38 y.o.   MRN: [id number]  Patient presents for 4 week F/U  Pt here to f/u recent UTI, 4 weeks ago , treated for UTI with hematuria, with Macrobid, still has some mild urgency but no dysuria. No pelvic pain/vaginal discharge-she did have some yeast in her urine at the last visit therefore was treated with Diflucan, no current abd pain. She does occasionally get loose stools when she gets what she calls a nervous stomach but there's been no change recently. She does smoke about a pack per day  Her last menstrual period was 3 1/2 weeks ago however she requested a urine pregnancy test today.  Review Of Systems: per above  GEN- denies fatigue, fever, weight loss,weakness, recent illness HEENT- denies eye drainage, change in vision, nasal discharge, CVS- denies chest pain, palpitations RESP- denies SOB, cough, wheeze ABD- denies N/V, change in stools, abd pain Neuro- denies headache, dizziness, syncope, seizure activity       Objective:    BP 120/64  Pulse 72  Temp(Src) 98.6 F (37 C) (Oral)  Resp 14  Ht 5\' 1"  (1.549 m)  Wt 110 lb (49.896 kg)  BMI 20.80 kg/m2 GEN- NAD, alert and oriented x3 CVS- RRR, no murmur RESP-CTAB ABD-NABS,soft,NT,ND, no CVA tenderness EXT- No edema Pulses- Radial 2+   Upreg NEG     Assessment & Plan:      Problem List Items Addressed This Visit   None    Visit Diagnoses   Urinary urgency    -  Primary    She still has some mild urgency but no overt signs of urine infection I will go ahead and send this for culture make sure everything is cleared    Relevant Orders       Urinalysis, Routine w reflex microscopic (Completed)       Pregnancy, urine (Completed)       Urine culture    Microscopic hematuria        She appears to have some chronic microscopic hematuria though microscopy only shows  0-2 red blood cells she is a smoker. Will make sure infection has cleared first may repeat another urine about 3-6 months to see if it is still present so she will need to see urology     Tobacco use disorder        Tobacco cessation discussed       Note: This dictation was prepared with Dragon dictation along with smaller phrase technology. Any transcriptional errors that result from this process are unintentional.

## 2014-04-03 NOTE — Patient Instructions (Signed)
We will call with results /FU pending

## 2014-04-04 LAB — URINE CULTURE
COLONY COUNT: NO GROWTH
ORGANISM ID, BACTERIA: NO GROWTH

## 2014-04-05 ENCOUNTER — Other Ambulatory Visit: Payer: Self-pay | Admitting: *Deleted

## 2014-04-05 DIAGNOSIS — R319 Hematuria, unspecified: Secondary | ICD-10-CM

## 2014-05-20 ENCOUNTER — Telehealth: Payer: Self-pay | Admitting: *Deleted

## 2014-05-20 NOTE — Telephone Encounter (Signed)
Symptoms sound like a cold.  Recommend symptomatic treatment only and should improve in 1 week.

## 2014-05-20 NOTE — Telephone Encounter (Signed)
Call placed to patient.   Reports that she has this every year and every year needs Z-pack.   States that she really thinks it's more than a cold.   Advised that she would need to be seen.   Appointment scheduled.

## 2014-05-20 NOTE — Telephone Encounter (Signed)
Received call from patient.   Reports that she has nasal congestion and slightly sore throat from nasal drainage.   Denies fever, cough, or body aches.   Reports that she gets this yearly and usually a Z-pack will resolve her issues.   Requested MD to call prescription to pharmacy.   MD please advise.

## 2014-05-21 ENCOUNTER — Ambulatory Visit (INDEPENDENT_AMBULATORY_CARE_PROVIDER_SITE_OTHER): Payer: 59 | Admitting: Family Medicine

## 2014-05-21 ENCOUNTER — Encounter: Payer: Self-pay | Admitting: Family Medicine

## 2014-05-21 VITALS — BP 110/68 | HR 82 | Temp 98.4°F | Resp 16 | Wt 110.0 lb

## 2014-05-21 DIAGNOSIS — J3089 Other allergic rhinitis: Secondary | ICD-10-CM

## 2014-05-21 NOTE — Progress Notes (Signed)
   Subjective:    Patient ID: Angela [name], female    DOB: [birth year], 38 y.o.   MRN: [id number]  HPI Patient symptoms began Sunday. She has headache head congestion. She has bilateral maxillary sinus pressure. She has moderate sinus headache. She has rhinorrhea which is nonpurulent. She has postnasal drip and cough. She gets this approximately the same time every year. She has tried one day of Zyrtec with minimal improvement. She denies any fevers or chills. She denies any dental pain. She is requesting a zpack. Past Medical History  Diagnosis Date  . Allergy     latex and codeine  . Heavy smoker (more than 20 cigarettes per day) 08/06/2011  . Anxiety     on lexapro   No past surgical history on file. Current Outpatient Prescriptions on File Prior to Visit  Medication Sig Dispense Refill  . escitalopram (LEXAPRO) 20 MG tablet Take 1 tablet (20 mg total) by mouth daily.  30 tablet  11   No current facility-administered medications on file prior to visit.   Allergies  Allergen Reactions  . Latex   . Codeine    History   Social History  . Marital Status: Single    Spouse Name: N/A    Number of Children: N/A  . Years of Education: N/A   Occupational History  . Not on file.   Social History Main Topics  . Smoking status: Current Every Day Smoker -- 1.00 packs/day for 15 years    Types: Cigarettes  . Smokeless tobacco: Never Used     Comment: trying to cut back and quit  . Alcohol Use: Yes     Comment: rarely  . Drug Use: No  . Sexual Activity: Yes    Birth Control/ Protection: None   Other Topics Concern  . Not on file   Social History Narrative  . No narrative on file      Review of Systems  All other systems reviewed and are negative.      Objective:   Physical Exam  Vitals reviewed. Constitutional: She appears well-developed and well-nourished. No distress.  HENT:  Right Ear: Tympanic membrane, external ear and ear canal normal.  Left Ear:  Tympanic membrane, external ear and ear canal normal.  Nose: Mucosal edema and rhinorrhea present. Right sinus exhibits no maxillary sinus tenderness and no frontal sinus tenderness. Left sinus exhibits no maxillary sinus tenderness and no frontal sinus tenderness.  Mouth/Throat: Oropharynx is clear and moist. No oropharyngeal exudate.  Neck: Neck supple.  Cardiovascular: Normal rate, regular rhythm and normal heart sounds.   Pulmonary/Chest: Effort normal and breath sounds normal. No respiratory distress. She has no wheezes. She has no rales.  Lymphadenopathy:    She has no cervical adenopathy.  Skin: She is not diaphoretic.          Assessment & Plan:  Other allergic rhinitis  I explained to the patient that I do not believe she has a sinus infection. Rather I believe this is allergic rhinosinusitis. She is failings check. Overall place the patient on a prednisone taper pack. Regular 20 mg tablets. She is to take 3 tablets on day 1 and day 2, 2 tablets on day 3 and 4, 1 tablet on day 5 and 6. I recommended she continue Zyrtec. Her symptoms worsened and she developed fevers I will gladly prescribe a Z-Pak but I warned the patient about developing anabotic resistance

## 2014-05-28 ENCOUNTER — Telehealth: Payer: Self-pay | Admitting: Physician Assistant

## 2014-05-28 NOTE — Telephone Encounter (Signed)
Begin z pack. 

## 2014-05-28 NOTE — Telephone Encounter (Signed)
Spoke to pt and she finished the prednisone and was better but now she is having a lot of nasal drainage that is getting into her chest, she has chest congestion and cough, mucous is dark greenish brown. She did go get the Z-pak but has not taken it yet as you told her to call us first. She has had no fever.

## 2014-05-28 NOTE — Telephone Encounter (Signed)
Patient is calling to talk with us about her previous visit with dr pickard she dosen't feel like she is getting any better  Please call her back at [phone]

## 2014-05-28 NOTE — Telephone Encounter (Signed)
LMTRC

## 2014-05-29 NOTE — Telephone Encounter (Signed)
Pt aware.

## 2014-09-17 ENCOUNTER — Encounter: Payer: Self-pay | Admitting: Physician Assistant

## 2014-10-25 ENCOUNTER — Ambulatory Visit (INDEPENDENT_AMBULATORY_CARE_PROVIDER_SITE_OTHER): Payer: 59 | Admitting: Obstetrics & Gynecology

## 2014-10-25 ENCOUNTER — Encounter: Payer: Self-pay | Admitting: Obstetrics & Gynecology

## 2014-10-25 VITALS — BP 113/74 | HR 63 | Ht 61.0 in | Wt 108.3 lb

## 2014-10-25 DIAGNOSIS — Z1151 Encounter for screening for human papillomavirus (HPV): Secondary | ICD-10-CM

## 2014-10-25 DIAGNOSIS — Z0000 Encounter for general adult medical examination without abnormal findings: Secondary | ICD-10-CM

## 2014-10-25 DIAGNOSIS — Z113 Encounter for screening for infections with a predominantly sexual mode of transmission: Secondary | ICD-10-CM

## 2014-10-25 DIAGNOSIS — Z01419 Encounter for gynecological examination (general) (routine) without abnormal findings: Secondary | ICD-10-CM

## 2014-10-25 DIAGNOSIS — Z118 Encounter for screening for other infectious and parasitic diseases: Secondary | ICD-10-CM

## 2014-10-25 DIAGNOSIS — Z124 Encounter for screening for malignant neoplasm of cervix: Secondary | ICD-10-CM

## 2014-10-25 LAB — HIV ANTIBODY (ROUTINE TESTING W REFLEX): HIV: NONREACTIVE

## 2014-10-25 LAB — HEPATITIS PANEL, ACUTE
HCV AB: NEGATIVE
HEP A IGM: NONREACTIVE
HEP B S AG: NEGATIVE
Hep B C IgM: NONREACTIVE

## 2014-10-25 LAB — RPR

## 2014-10-25 NOTE — Progress Notes (Signed)
Subjective:    Angela [name] is a 39 y.o. SW G0 female who presents for an annual exam. The patient has no complaints today. She would like STI testing.  The patient is not currently sexually active. GYN screening history: last pap: was normal. The patient wears seatbelts: yes. The patient participates in regular exercise: no. Has the patient ever been transfused or tattooed?: yes. The patient reports that there is not domestic violence in her life.   Menstrual History: OB History    No data available      Menarche age: [age]  No LMP recorded.    The following portions of the patient's history were reviewed and updated as appropriate: allergies, current medications, past family history, past medical history, past social history, past surgical history and problem list.  Review of Systems A comprehensive review of systems was negative. Abstinent for a month. [place] at [employer] [place]. Declines a flu vaccine. Denies FH of breast or colon cancer.   Objective:    There were no vitals taken for this visit.  General Appearance:    Alert, cooperative, no distress, appears stated age  Head:    Normocephalic, without obvious abnormality, atraumatic  Eyes:    PERRL, conjunctiva/corneas clear, EOM's intact, fundi    benign, both eyes  Ears:    Normal TM's and external ear canals, both ears  Nose:   Nares normal, septum midline, mucosa normal, no drainage    or sinus tenderness  Throat:   Lips, mucosa, and tongue normal; teeth and gums normal  Neck:   Supple, symmetrical, trachea midline, no adenopathy;    thyroid:  no enlargement/tenderness/nodules; no carotid   bruit or JVD  Back:     Symmetric, no curvature, ROM normal, no CVA tenderness  Lungs:     Clear to auscultation bilaterally, respirations unlabored  Chest Wall:    No tenderness or deformity   Heart:    Regular rate and rhythm, S1 and S2 normal, no murmur, rub   or gallop  Breast Exam:    No tenderness, masses, or  nipple abnormality  Abdomen:     Soft, non-tender, bowel sounds active all four quadrants,    no masses, no organomegaly  Genitalia:    Normal female without lesion, discharge or tenderness, NSSA, NT, mobile, normal adnexa     Extremities:   Extremities normal, atraumatic, no cyanosis or edema  Pulses:   2+ and symmetric all extremities  Skin:   Skin color, texture, turgor normal, no rashes or lesions  Lymph nodes:   Cervical, supraclavicular, and axillary nodes normal  Neurologic:   CNII-XII intact, normal strength, sensation and reflexes    throughout  .    Assessment:    Healthy female exam.    Plan:     Breast self exam technique reviewed and patient encouraged to perform self-exam monthly. Chlamydia specimen. GC specimen. Thin prep Pap smear. STI testing

## 2014-10-29 ENCOUNTER — Telehealth: Payer: Self-pay | Admitting: *Deleted

## 2014-10-29 LAB — CYTOLOGY - PAP

## 2014-10-29 NOTE — Telephone Encounter (Addendum)
Pt left message requesting results from visit on 3/4.  I returned her call and left message that I am calling back with some of her results. All tests have not yet been completed. Please call back and state whether a detailed message can be left on her voice mail.

## 2014-10-30 NOTE — Telephone Encounter (Signed)
Patient called in to front office and I informed her of normal results. Patient verbalized understanding to all and had no questions

## 2014-11-04 ENCOUNTER — Encounter: Payer: Self-pay | Admitting: Physician Assistant

## 2014-11-25 ENCOUNTER — Other Ambulatory Visit: Payer: Self-pay | Admitting: Physician Assistant

## 2014-11-26 NOTE — Telephone Encounter (Signed)
Medication refilled per protocol. 

## 2014-12-12 ENCOUNTER — Encounter: Payer: Self-pay | Admitting: Physician Assistant

## 2015-03-31 ENCOUNTER — Telehealth: Payer: Self-pay | Admitting: Family Medicine

## 2015-03-31 MED ORDER — ESCITALOPRAM OXALATE 20 MG PO TABS: 20.0000 mg | ORAL_TABLET | Freq: Every day | ORAL | Status: DC

## 2015-03-31 NOTE — Telephone Encounter (Signed)
Medication refilled per protocol. 

## 2015-04-08 ENCOUNTER — Ambulatory Visit (INDEPENDENT_AMBULATORY_CARE_PROVIDER_SITE_OTHER): Payer: 59 | Admitting: Family Medicine

## 2015-04-08 ENCOUNTER — Encounter: Payer: Self-pay | Admitting: Family Medicine

## 2015-04-08 VITALS — BP 110/68 | HR 78 | Temp 98.0°F | Resp 14 | Ht 61.0 in | Wt 104.0 lb

## 2015-04-08 DIAGNOSIS — J069 Acute upper respiratory infection, unspecified: Secondary | ICD-10-CM

## 2015-04-08 DIAGNOSIS — F419 Anxiety disorder, unspecified: Secondary | ICD-10-CM | POA: Diagnosis not present

## 2015-04-08 DIAGNOSIS — J0100 Acute maxillary sinusitis, unspecified: Secondary | ICD-10-CM

## 2015-04-08 MED ORDER — ESCITALOPRAM OXALATE 20 MG PO TABS: 20.0000 mg | ORAL_TABLET | Freq: Every day | ORAL | Status: DC

## 2015-04-08 MED ORDER — AZITHROMYCIN 250 MG PO TABS: ORAL_TABLET | ORAL | Status: DC

## 2015-04-08 NOTE — Assessment & Plan Note (Signed)
Despite father passing doing fairly well, no change to dose, aware to contact us if she has increased anxiety or depressive symptoms, no red flags noted today

## 2015-04-08 NOTE — Progress Notes (Signed)
Patient ID: Angela [name], female   DOB: 11-04-1975, 39 y.o.   MRN: [id number]   Subjective:    Patient ID: Angela [name], female    DOB: 11-May-1976, 39 y.o.   MRN: [id number]  Patient presents for Illness  patient here with sinus pressure drainage for the past 4-5 days she has history of recurrent sinusitis that typically gets it in the spring. She's also had some cough with mild production mostly from drainage in her ears have been popping. She's not had any fever no known sick contacts. She is taking Zyrtec-D which she started 2 days ago she's also using over-the-counter nasal saline She has history of anxiety she's been on Lexapro her father passed away a month ago and this been difficult for the family but she thinks with her medication she has been handling things fairly well. Her sister has been helping her with her father's estate business.    Review Of Systems:  GEN- denies fatigue, fever, weight loss,weakness, recent illness HEENT- denies eye drainage, change in vision, +nasal discharge, CVS- denies chest pain, palpitations RESP- denies SOB, +cough, wheeze ABD- denies N/V, change in stools, abd pain GU- denies dysuria, hematuria, dribbling, incontinence MSK- denies joint pain, muscle aches, injury Neuro- denies headache, dizziness, syncope, seizure activity       Objective:    BP 110/68 mmHg  Pulse 78  Temp(Src) 98 F (36.7 C) (Oral)  Resp 14  Ht  (1.549 m)  Wt 104 lb (47.174 kg)  BMI 19.66 kg/m2  LMP 04/01/2015 (Approximate) GEN- NAD, alert and oriented x3 HEENT- PERRL, EOMI, non injected sclera, pink conjunctiva, MMM, oropharynx mild injection, TM clear bilat no effusion,  + maxillary sinus tenderness, inflammed turbinates,  Nasal drainage  Neck- Supple, no LAD CVS- RRR, no murmur RESP-CTAB Psych- normal affect and mood EXT- No edema Pulses- Radial 2+          Assessment & Plan:      Problem List Items Addressed This Visit    Anxiety   Relevant Medications   escitalopram (LEXAPRO) 20 MG tablet    Other Visit Diagnoses    Acute URI    -  Primary    URI with now some sinusitis setting in, smoker, history of sinusitis, continue claritin D, add zpak, continue nasal rinses    Relevant Medications    azithromycin (ZITHROMAX) 250 MG tablet    Acute maxillary sinusitis, recurrence not specified        Relevant Medications    cetirizine-pseudoephedrine (ZYRTEC-D) 5-120 MG per tablet    azithromycin (ZITHROMAX) 250 MG tablet       Note: This dictation was prepared with Dragon dictation along with smaller phrase technology. Any transcriptional errors that result from this process are unintentional.

## 2015-04-08 NOTE — Patient Instructions (Signed)
Continue decongestant Antibiotics sent Continue lexapro Call if not improved F/U as needed

## 2015-10-03 ENCOUNTER — Ambulatory Visit (INDEPENDENT_AMBULATORY_CARE_PROVIDER_SITE_OTHER): Payer: BLUE CROSS/BLUE SHIELD | Admitting: Obstetrics & Gynecology

## 2015-10-03 ENCOUNTER — Encounter: Payer: Self-pay | Admitting: Obstetrics & Gynecology

## 2015-10-03 VITALS — BP 106/71 | HR 67 | Temp 97.8°F | Wt 112.1 lb

## 2015-10-03 DIAGNOSIS — Z01419 Encounter for gynecological examination (general) (routine) without abnormal findings: Secondary | ICD-10-CM | POA: Diagnosis not present

## 2015-10-03 DIAGNOSIS — Z124 Encounter for screening for malignant neoplasm of cervix: Secondary | ICD-10-CM

## 2015-10-03 DIAGNOSIS — Z113 Encounter for screening for infections with a predominantly sexual mode of transmission: Secondary | ICD-10-CM

## 2015-10-03 DIAGNOSIS — Z1151 Encounter for screening for human papillomavirus (HPV): Secondary | ICD-10-CM

## 2015-10-03 DIAGNOSIS — Z0000 Encounter for general adult medical examination without abnormal findings: Secondary | ICD-10-CM

## 2015-10-03 LAB — CBC
HEMATOCRIT: 39.4 % (ref 36.0–46.0)
Hemoglobin: 12.9 g/dL (ref 12.0–15.0)
MCH: 31.2 pg (ref 26.0–34.0)
MCHC: 32.7 g/dL (ref 30.0–36.0)
MCV: 95.2 fL (ref 78.0–100.0)
MPV: 10.1 fL (ref 8.6–12.4)
Platelets: 250 10*3/uL (ref 150–400)
RBC: 4.14 MIL/uL (ref 3.87–5.11)
RDW: 13.9 % (ref 11.5–15.5)
WBC: 5.2 10*3/uL (ref 4.0–10.5)

## 2015-10-03 LAB — COMPREHENSIVE METABOLIC PANEL
ALK PHOS: 48 U/L (ref 33–115)
ALT: 13 U/L (ref 6–29)
AST: 16 U/L (ref 10–30)
Albumin: 4 g/dL (ref 3.6–5.1)
BILIRUBIN TOTAL: 0.5 mg/dL (ref 0.2–1.2)
BUN: 11 mg/dL (ref 7–25)
CALCIUM: 9.1 mg/dL (ref 8.6–10.2)
CO2: 25 mmol/L (ref 20–31)
Chloride: 106 mmol/L (ref 98–110)
Creat: 0.75 mg/dL (ref 0.50–1.10)
GLUCOSE: 74 mg/dL (ref 65–99)
POTASSIUM: 4.1 mmol/L (ref 3.5–5.3)
Sodium: 140 mmol/L (ref 135–146)
TOTAL PROTEIN: 6.5 g/dL (ref 6.1–8.1)

## 2015-10-03 LAB — LIPID PANEL
CHOL/HDL RATIO: 3.1 ratio (ref <=5.0)
Cholesterol: 168 mg/dL (ref 125–200)
HDL: 54 mg/dL (ref >=46)
LDL Cholesterol: 103 mg/dL (ref <130)
TRIGLYCERIDES: 54 mg/dL (ref <150)
VLDL: 11 mg/dL (ref <30)

## 2015-10-03 LAB — TSH: TSH: 1.05 m[IU]/L

## 2015-10-03 NOTE — Addendum Note (Signed)
Addended by: Faythe Casa on: 10/03/2015 12:09 PM   Modules accepted: Orders

## 2015-10-03 NOTE — Patient Instructions (Signed)
Smoking Cessation, Tips for Success If you are ready to quit smoking, congratulations! You have chosen to help yourself be healthier. Cigarettes bring nicotine, tar, carbon monoxide, and other irritants into your body. Your lungs, heart, and blood vessels will be able to work better without these poisons. There are many different ways to quit smoking. Nicotine gum, nicotine patches, a nicotine inhaler, or nicotine nasal spray can help with physical craving. Hypnosis, support groups, and medicines help break the habit of smoking. WHAT THINGS CAN I DO TO MAKE QUITTING EASIER?  Here are some tips to help you quit for good:  Pick a date when you will quit smoking completely. Tell all of your friends and family about your plan to quit on that date.  Do not try to slowly cut down on the number of cigarettes you are smoking. Pick a quit date and quit smoking completely starting on that day.  Throw away all cigarettes.   Clean and remove all ashtrays from your home, work, and car.  On a card, write down your reasons for quitting. Carry the card with you and read it when you get the urge to smoke.  Cleanse your body of nicotine. Drink enough water and fluids to keep your urine clear or pale yellow. Do this after quitting to flush the nicotine from your body.  Learn to predict your moods. Do not let a bad situation be your excuse to have a cigarette. Some situations in your life might tempt you into wanting a cigarette.  Never have "just one" cigarette. It leads to wanting another and another. Remind yourself of your decision to quit.  Change habits associated with smoking. If you smoked while driving or when feeling stressed, try other activities to replace smoking. Stand up when drinking your coffee. Brush your teeth after eating. Sit in a different chair when you read the paper. Avoid alcohol while trying to quit, and try to drink fewer caffeinated beverages. Alcohol and caffeine may urge you to  smoke.  Avoid foods and drinks that can trigger a desire to smoke, such as sugary or spicy foods and alcohol.  Ask people who smoke not to smoke around you.  Have something planned to do right after eating or having a cup of coffee. For example, plan to take a walk or exercise.  Try a relaxation exercise to calm you down and decrease your stress. Remember, you may be tense and nervous for the first 2 weeks after you quit, but this will pass.  Find new activities to keep your hands busy. Play with a pen, coin, or rubber band. Doodle or draw things on paper.  Brush your teeth right after eating. This will help cut down on the craving for the taste of tobacco after meals. You can also try mouthwash.   Use oral substitutes in place of cigarettes. Try using lemon drops, carrots, cinnamon sticks, or chewing gum. Keep them handy so they are available when you have the urge to smoke.  When you have the urge to smoke, try deep breathing.  Designate your home as a nonsmoking area.  If you are a heavy smoker, ask your health care provider about a prescription for nicotine chewing gum. It can ease your withdrawal from nicotine.  Reward yourself. Set aside the cigarette money you save and buy yourself something nice.  Look for support from others. Join a support group or smoking cessation program. Ask someone at home or at work to help you with your plan   to quit smoking.  Always ask yourself, "Do I need this cigarette or is this just a reflex?" Tell yourself, "Today, I choose not to smoke," or "I do not want to smoke." You are reminding yourself of your decision to quit.  Do not replace cigarette smoking with electronic cigarettes (commonly called e-cigarettes). The safety of e-cigarettes is unknown, and some may contain harmful chemicals.  If you relapse, do not give up! Plan ahead and think about what you will do the next time you get the urge to smoke. HOW WILL I FEEL WHEN I QUIT SMOKING? You  may have symptoms of withdrawal because your body is used to nicotine (the addictive substance in cigarettes). You may crave cigarettes, be irritable, feel very hungry, cough often, get headaches, or have difficulty concentrating. The withdrawal symptoms are only temporary. They are strongest when you first quit but will go away within 10-14 days. When withdrawal symptoms occur, stay in control. Think about your reasons for quitting. Remind yourself that these are signs that your body is healing and getting used to being without cigarettes. Remember that withdrawal symptoms are easier to treat than the major diseases that smoking can cause.  Even after the withdrawal is over, expect periodic urges to smoke. However, these cravings are generally short lived and will go away whether you smoke or not. Do not smoke! WHAT RESOURCES ARE AVAILABLE TO HELP ME QUIT SMOKING? Your health care provider can direct you to community resources or hospitals for support, which may include:  Group support.  Education.  Hypnosis.  Therapy.   This information is not intended to replace advice given to you by your health care provider. Make sure you discuss any questions you have with your health care provider.   Document Released: 05/07/2004 Document Revised: 08/30/2014 Document Reviewed: 01/25/2013 Elsevier Interactive Patient Education 2016 [employer].   Sexually Transmitted Disease A sexually transmitted disease (STD) is a disease or infection that may be passed (transmitted) from person to person, usually during sexual activity. This may happen by way of saliva, semen, blood, vaginal mucus, or urine. Common STDs include:  Gonorrhea.  Chlamydia.  Syphilis.  HIV and AIDS.  Genital herpes.  Hepatitis B and C.  Trichomonas.  Human papillomavirus (HPV).  Pubic lice.  Scabies.  Mites.  Bacterial vaginosis. WHAT ARE CAUSES OF STDs? An STD may be caused by bacteria, a virus, or parasites.  STDs are often transmitted during sexual activity if one person is infected. However, they may also be transmitted through nonsexual means. STDs may be transmitted after:   Sexual intercourse with an infected person.  Sharing sex toys with an infected person.  Sharing needles with an infected person or using unclean piercing or tattoo needles.  Having intimate contact with the genitals, mouth, or rectal areas of an infected person.  Exposure to infected fluids during birth. WHAT ARE THE SIGNS AND SYMPTOMS OF STDs? Different STDs have different symptoms. Some people may not have any symptoms. If symptoms are present, they may include:  Painful or bloody urination.  Pain in the pelvis, abdomen, vagina, anus, throat, or eyes.  A skin rash, itching, or irritation.  Growths, ulcerations, blisters, or sores in the genital and anal areas.  Abnormal vaginal discharge with or without bad odor.  Penile discharge in men.  Fever.  Pain or bleeding during sexual intercourse.  Swollen glands in the groin area.  Yellow skin and eyes (jaundice). This is seen with hepatitis.  Swollen testicles.  Infertility.  Sores and blisters in the mouth. HOW ARE STDs DIAGNOSED? To make a diagnosis, your health care provider may:  Take a medical history.  Perform a physical exam.  Take a sample of any discharge to examine.  Swab the throat, cervix, opening to the penis, rectum, or vagina for testing.  Test a sample of your first morning urine.  Perform blood tests.  Perform a Pap test, if this applies.  Perform a colposcopy.  Perform a laparoscopy. HOW ARE STDs TREATED? Treatment depends on the STD. Some STDs may be treated but not cured.  Chlamydia, gonorrhea, trichomonas, and syphilis can be cured with antibiotic medicine.  Genital herpes, hepatitis, and HIV can be treated, but not cured, with prescribed medicines. The medicines lessen symptoms.  Genital warts from HPV can be  treated with medicine or by freezing, burning (electrocautery), or surgery. Warts may come back.  HPV cannot be cured with medicine or surgery. However, abnormal areas may be removed from the cervix, vagina, or vulva.  If your diagnosis is confirmed, your recent sexual partners need treatment. This is true even if they are symptom-free or have a negative culture or evaluation. They should not have sex until their health care providers say it is okay.  Your health care provider may test you for infection again 3 months after treatment. HOW CAN I REDUCE MY RISK OF GETTING AN STD? Take these steps to reduce your risk of getting an STD:  Use latex condoms, dental dams, and water-soluble lubricants during sexual activity. Do not use petroleum jelly or oils.  Avoid having multiple sex partners.  Do not have sex with someone who has other sex partners  Do not have sex with anyone you do not know or who is at high risk for an STD.  Avoid risky sex practices that can break your skin.  Do not have sex if you have open sores on your mouth or skin.  Avoid drinking too much alcohol or taking illegal drugs. Alcohol and drugs can affect your judgment and put you in a vulnerable position.  Avoid engaging in oral and anal sex acts.  Get vaccinated for HPV and hepatitis. If you have not received these vaccines in the past, talk to your health care provider about whether one or both might be right for you.  If you are at risk of being infected with HIV, it is recommended that you take a prescription medicine daily to prevent HIV infection. This is called pre-exposure prophylaxis (PrEP). You are considered at risk if:  You are a man who has sex with other men (MSM).  You are a heterosexual man or woman and are sexually active with more than one partner.  You take drugs by injection.  You are sexually active with a partner who has HIV.  Talk with your health care provider about whether you are at  high risk of being infected with HIV. If you choose to begin PrEP, you should first be tested for HIV. You should then be tested every 3 months for as long as you are taking PrEP. WHAT SHOULD I DO IF I THINK I HAVE AN STD?  See your health care provider.  Tell your sexual partner(s). They should be tested and treated for any STDs.  Do not have sex until your health care provider says it is okay. WHEN SHOULD I GET IMMEDIATE MEDICAL CARE? Contact your health care provider right away if:   You have severe abdominal pain.  You are a  man and notice swelling or pain in your testicles.  You are a woman and notice swelling or pain in your vagina.   This information is not intended to replace advice given to you by your health care provider. Make sure you discuss any questions you have with your health care provider.   Document Released: 10/30/2002 Document Revised: 08/30/2014 Document Reviewed: 02/27/2013 Elsevier Interactive Patient Education [employer]

## 2015-10-03 NOTE — Progress Notes (Signed)
Patient ID: Angela [name], female   DOB: 10-24-1975, 40 y.o.   MRN: [id number] Subjective:     Angela [name] is a 40 y.o. female here for a routine exam.  Current complaints: pt reports now partner since 6-8 months.  Pt uses condoms occ.  Not using any other contraception. Denies discharge or other sx.  Reports that she has not had a screening PE in many years.    Gynecologic History Patient's last menstrual period was 09/28/2015 (exact date). Contraception: condoms Last Pap: 10/2014. Results were: normal with neg hrHPV Last mammogram: n/a.  Obstetric History OB History  Gravida Para Term Preterm AB SAB TAB Ectopic Multiple Living          The following portions of the patient's history were reviewed and updated as appropriate: allergies, current medications, past family history, past medical history, past social history, past surgical history and problem list.  Review of Systems Pertinent items are noted in HPI.    Objective:  BP 106/71 mmHg  Pulse 67  Temp(Src) 97.8 F (36.6 C)  Wt 112 lb 1.6 oz (50.848 kg)  LMP 09/28/2015 (Exact Date)  General Appearance:    Alert, cooperative, no distress, appears stated age  Head:    Normocephalic, without obvious abnormality, atraumatic  Eyes:    conjunctiva/corneas clear, EOM's intact, both eyes  Ears:    Normal external ear canals, both ears  Nose:   Nares normal, septum midline, mucosa normal, no drainage    or sinus tenderness  Throat:   Lips, mucosa, and tongue normal; teeth and gums normal  Neck:   Supple, symmetrical, trachea midline, no adenopathy;    thyroid:  no enlargement/tenderness/nodules  Back:     Symmetric, no curvature, ROM normal, no CVA tenderness  Lungs:     Clear to auscultation bilaterally, respirations unlabored  Chest Wall:    No tenderness or deformity   Heart:    Regular rate and rhythm, S1 and S2 normal, no murmur, rub   or gallop  Breast Exam:    No tenderness, masses, or nipple  abnormality  Abdomen:     Soft, non-tender, bowel sounds active all four quadrants,    no masses, no organomegaly  Genitalia:    Normal female without lesion, discharge or tenderness     Extremities:   Extremities normal, atraumatic, no cyanosis or edema  Pulses:   2+ and symmetric all extremities  Skin:   Skin color, texture, turgor normal, no rashes or lesions     Assessment:    Healthy female exam.   STI screen  tob abuse     Plan:    Contraception: condoms. Follow up in: 1 year.    Labs: HIV, RPR, Hepatitis, CBC, CMP, Lipid panel, TSH  Pt to decrease amount of tob  Sharnee Douglass L. Harraway-Smith, M.D., [place]

## 2015-10-04 LAB — HIV ANTIBODY (ROUTINE TESTING W REFLEX): HIV 1&2 Ab, 4th Generation: NONREACTIVE

## 2015-10-04 LAB — HEPATITIS PANEL, ACUTE
HCV Ab: NEGATIVE
HEP B C IGM: NONREACTIVE
Hep A IgM: NONREACTIVE
Hepatitis B Surface Ag: NEGATIVE

## 2015-10-04 LAB — RPR

## 2015-10-06 LAB — GC/CHLAMYDIA PROBE AMP (~~LOC~~) NOT AT ARMC
Chlamydia: NEGATIVE
Neisseria Gonorrhea: NEGATIVE

## 2015-10-07 LAB — CYTOLOGY - PAP

## 2015-10-08 ENCOUNTER — Telehealth: Payer: Self-pay | Admitting: General Practice

## 2015-10-08 NOTE — Telephone Encounter (Signed)
Per Dr Erin Fulling, all patient's labs and pap were normal. Called patient, no answer- left message stating we are trying to reach you with non urgent results, please call us back at the clinics. Patient called back into front office and I informed her of normal results. Patient verbalized understanding & had no questions

## 2016-04-18 ENCOUNTER — Other Ambulatory Visit: Payer: Self-pay | Admitting: Family Medicine

## 2016-04-20 ENCOUNTER — Other Ambulatory Visit: Payer: Self-pay | Admitting: Family Medicine

## 2016-04-20 MED ORDER — ESCITALOPRAM OXALATE 20 MG PO TABS: 20.0000 mg | ORAL_TABLET | Freq: Every day | ORAL | 0 refills | Status: DC

## 2016-04-20 NOTE — Telephone Encounter (Signed)
Pt has not been seen in over 1 year.  14 day refill to pharmacy.  Letter to patient that she must be seen.

## 2016-04-21 ENCOUNTER — Other Ambulatory Visit: Payer: Self-pay | Admitting: *Deleted

## 2016-05-13 ENCOUNTER — Encounter: Payer: Self-pay | Admitting: Physician Assistant

## 2016-05-13 ENCOUNTER — Ambulatory Visit (INDEPENDENT_AMBULATORY_CARE_PROVIDER_SITE_OTHER): Payer: BLUE CROSS/BLUE SHIELD | Admitting: Physician Assistant

## 2016-05-13 VITALS — BP 100/68 | HR 85 | Temp 97.9°F | Resp 16 | Wt 110.0 lb

## 2016-05-13 DIAGNOSIS — F17210 Nicotine dependence, cigarettes, uncomplicated: Secondary | ICD-10-CM | POA: Diagnosis not present

## 2016-05-13 DIAGNOSIS — F419 Anxiety disorder, unspecified: Secondary | ICD-10-CM | POA: Diagnosis not present

## 2016-05-13 MED ORDER — ESCITALOPRAM OXALATE 20 MG PO TABS: 20.0000 mg | ORAL_TABLET | Freq: Every day | ORAL | 5 refills | Status: DC

## 2016-05-13 NOTE — Progress Notes (Signed)
    Patient ID: Angela [name] MRN: [id number], DOB: 1976/06/08, 40 y.o. Date of Encounter: 05/13/2016, 8:31 AM    Chief Complaint:  Chief Complaint  Patient presents with  . Follow-up     HPI: 40 y.o. year old white female for follow-up so she can get refills on her Lexapro.  He is taking Lexapro 20 mg daily. States that this is working well and is controlling her anxiety. States that her mother was recently diagnosed with stage I breast cancer. Did a lumpectomy and gave her the option of whether to do chemotherapy but she is going to do the chemotherapy followed by radiation. Pt states that so far she is handling this okay and feels stable regarding her anxiety says she will call me if this changes. The Lexapro is causing no adverse effects.  We also discussed her smoking. Asked if she was interested in trying to quit. She says that since her mom was diagnosed with this cancer it has made her think a lot more about the need to quit smoking but says that she " feels like she is half way there" regarding being motivated to quit. Says that she will call me if she feels like she has made up her mind to be committed to quitting.  No other complaints or concerns today.     Home Meds:   Outpatient Medications Prior to Visit  Medication Sig Dispense Refill  . azithromycin (ZITHROMAX) 250 MG tablet Take 2 tablets x 1 day, then 1 tab daily for 4 days 6 tablet 0  . cetirizine-pseudoephedrine (ZYRTEC-D) 5-120 MG per tablet Take 1 tablet by mouth 2 (two) times daily.    Marland Kitchen. escitalopram (LEXAPRO) 20 MG tablet Take 1 tablet (20 mg total) by mouth daily. 14 tablet 0   No facility-administered medications prior to visit.     Allergies:  Allergies  Allergen Reactions  . Latex   . Augmentin [Amoxicillin-Pot Clavulanate]     GI Upset  . Codeine       Review of Systems: See HPI for pertinent ROS. All other ROS negative.    Physical Exam: Blood pressure 100/68, pulse 85, temperature 97.9  F (36.6 C), temperature source Oral, resp. rate 16, weight 110 lb (49.9 kg), last menstrual period 04/12/2016., Body mass index is 20.78 kg/m. General:  WNWD WF. Appears in no acute distress. Neck: Supple. No thyromegaly. No lymphadenopathy. Lungs: Clear bilaterally to auscultation without wheezes, rales, or rhonchi. Breathing is unlabored. Heart: Regular rhythm. No murmurs, rubs, or gallops. Msk:  Strength and tone normal for age. Extremities/Skin: Warm and dry. Neuro: Alert and oriented X 3. Moves all extremities spontaneously. Gait is normal. CNII-XII grossly in tact. Psych:  Responds to questions appropriately with a normal affect.     ASSESSMENT AND PLAN:  40 y.o. year old female with  1. Anxiety Stable and well controlled. Will continue Lexapro 20 mg daily. Discussed with her that she does need routine follow-up visit in 6 months and she is agreeable. - escitalopram (LEXAPRO) 20 MG tablet; Take 1 tablet (20 mg total) by mouth daily.  Dispense: 30 tablet; Refill: 5  2. Heavy smoker (more than 20 cigarettes per day) She will follow-up with me if she decides that she is motivated to committed to quitting.  [id number]igned, Mary Beth [place], [place], [facility] 05/13/2016 8:31 AM

## 2016-12-17 ENCOUNTER — Encounter: Payer: Self-pay | Admitting: Family Medicine

## 2016-12-17 ENCOUNTER — Ambulatory Visit (INDEPENDENT_AMBULATORY_CARE_PROVIDER_SITE_OTHER): Payer: BLUE CROSS/BLUE SHIELD | Admitting: Family Medicine

## 2016-12-17 VITALS — BP 100/68 | HR 86 | Temp 98.7°F | Resp 16 | Wt 106.0 lb

## 2016-12-17 DIAGNOSIS — K047 Periapical abscess without sinus: Secondary | ICD-10-CM

## 2016-12-17 MED ORDER — PROMETHAZINE HCL 25 MG PO TABS: 25.0000 mg | ORAL_TABLET | Freq: Three times a day (TID) | ORAL | 0 refills | Status: DC | PRN

## 2016-12-17 MED ORDER — AMOXICILLIN-POT CLAVULANATE 875-125 MG PO TABS: 1.0000 | ORAL_TABLET | Freq: Two times a day (BID) | ORAL | 0 refills | Status: DC

## 2016-12-17 NOTE — Progress Notes (Signed)
   Subjective:    Patient ID: Angela [name], female    DOB: 03/14/1976, 41 y.o.   MRN: [id number]  HPI  Patient reports pain in her lower right jaw below her canine and first premolar tooth.  Dentition in this area is poor with cavities. The jaw is swollen and red and painful. There is visible external swelling. Patient has an appointment for an extraction on Tuesday but is currently not taking any antibiotics. She denies any fevers. Past Medical History:  Diagnosis Date  . Allergy    latex and codeine  . Anxiety    on lexapro  . Heavy smoker (more than 20 cigarettes per day) 08/06/2011   No past surgical history on file. Current Outpatient Prescriptions on File Prior to Visit  Medication Sig Dispense Refill  . cetirizine-pseudoephedrine (ZYRTEC-D) 5-120 MG per tablet Take 1 tablet by mouth 2 (two) times daily.    Marland Kitchen escitalopram (LEXAPRO) 20 MG tablet Take 1 tablet (20 mg total) by mouth daily. 30 tablet 5   No current facility-administered medications on file prior to visit.    Allergies  Allergen Reactions  . Latex   . Augmentin [Amoxicillin-Pot Clavulanate]     GI Upset  . Codeine    Social History   Social History  . Marital status: Single    Spouse name: N/A  . Number of children: N/A  . Years of education: N/A   Occupational History  . Not on file.   Social History Main Topics  . Smoking status: Current Every Day Smoker    Packs/day: 1.00    Years: 15.00    Types: Cigarettes  . Smokeless tobacco: Never Used     Comment: trying to cut back and quit  . Alcohol use Yes     Comment: rarely  . Drug use: No  . Sexual activity: Yes    Birth control/ protection: None   Other Topics Concern  . Not on file   Social History Narrative  . No narrative on file     Review of Systems  All other systems reviewed and are negative.      Objective:   Physical Exam  HENT:  Mouth/Throat: Abnormal dentition. Dental abscesses and dental caries present.     Cardiovascular: Normal rate, regular rhythm and normal heart sounds.   Pulmonary/Chest: Effort normal and breath sounds normal.  Vitals reviewed.  Area outlined in red is swollen erythematous and tender       Assessment & Plan:  Dental infection  Suspect the patient is developing abscess tooth. Needs extraction. Has appointment to see the dentist on Tuesday. Begin Augmentin 875 mg by mouth twice a day and ibuprofen 800 mg by mouth 3 times a day. If worsening over the weekend, patient will need to go the emergency room. Allergy to Augmentin and stomach upset. I encouraged her to try to push through it as I believe this is the most appropriate antibiotic to cover the oral mucosa

## 2016-12-24 ENCOUNTER — Telehealth: Payer: Self-pay | Admitting: Physician Assistant

## 2016-12-24 NOTE — Telephone Encounter (Signed)
Ok with stopping abx.

## 2016-12-24 NOTE — Telephone Encounter (Signed)
Pt called and states that she is having stomach pain, bloating and a lot of gas with the antibx and wanted to know what to do? She had her tooth extracted Tuesday and everything feels fine with no fever. Told her to stop the antibx, eat yogurt and take probiotic to help with GI symptoms and if she develops any of those symptoms to please call us back.

## 2016-12-24 NOTE — Telephone Encounter (Signed)
Patient is calling to ask questions about her Augmentin please call her at [phone]

## 2017-01-14 ENCOUNTER — Telehealth: Payer: Self-pay | Admitting: Family Medicine

## 2017-01-14 NOTE — Telephone Encounter (Signed)
This encounter was created in error - please disregard.

## 2017-01-14 NOTE — Addendum Note (Signed)
Addended by: Legrand RamsWILLIS, Babe Clenney B on: 01/14/2017 11:31 AM   Modules accepted: Level of Service, SmartSet

## 2017-01-14 NOTE — Telephone Encounter (Signed)
Pt called stating she needs note written and signed by pickard for her to be able to gain weight. Her goal for this year is 183-185lbs, she is in a weight loss group called [facility] and they are supposed to be losing weight but she lost more last year then expected because she was on Chemo, so they need a doctor note saying its okay to gain weight °

## 2017-01-19 ENCOUNTER — Encounter: Payer: Self-pay | Admitting: Obstetrics & Gynecology

## 2017-01-19 ENCOUNTER — Ambulatory Visit (INDEPENDENT_AMBULATORY_CARE_PROVIDER_SITE_OTHER): Payer: BLUE CROSS/BLUE SHIELD | Admitting: Obstetrics & Gynecology

## 2017-01-19 VITALS — BP 105/77 | HR 69 | Ht 61.0 in | Wt 108.5 lb

## 2017-01-19 DIAGNOSIS — Z01419 Encounter for gynecological examination (general) (routine) without abnormal findings: Secondary | ICD-10-CM

## 2017-01-19 DIAGNOSIS — Z1389 Encounter for screening for other disorder: Secondary | ICD-10-CM

## 2017-01-19 DIAGNOSIS — Z1231 Encounter for screening mammogram for malignant neoplasm of breast: Secondary | ICD-10-CM

## 2017-01-19 DIAGNOSIS — Z113 Encounter for screening for infections with a predominantly sexual mode of transmission: Secondary | ICD-10-CM

## 2017-01-19 DIAGNOSIS — N852 Hypertrophy of uterus: Secondary | ICD-10-CM

## 2017-01-19 LAB — POCT URINALYSIS DIP (DEVICE)
BILIRUBIN URINE: NEGATIVE
Glucose, UA: NEGATIVE mg/dL
HGB URINE DIPSTICK: NEGATIVE
Ketones, ur: NEGATIVE mg/dL
LEUKOCYTES UA: NEGATIVE
NITRITE: NEGATIVE
PH: 5.5 (ref 5.0–8.0)
Protein, ur: NEGATIVE mg/dL
SPECIFIC GRAVITY, URINE: 1.02 (ref 1.005–1.030)
UROBILINOGEN UA: 0.2 mg/dL (ref 0.0–1.0)

## 2017-01-19 NOTE — Patient Instructions (Signed)

## 2017-01-19 NOTE — Addendum Note (Signed)
Addended by: Kathee DeltonHILLMAN, Beadie Matsunaga L on: 01/19/2017 09:33 AM   Modules accepted: Orders

## 2017-01-19 NOTE — Progress Notes (Signed)
Subjective:     Angela [name] is a 41 y.o. female here for a routine exam.  G0 LMP 3 weeks prev. 24-28 weeks cycles. Current complaints: Pts mother was dx'd Stage 1 breast cancer. Pt wants to be screened for STI's today. Pt denies known exposure to STIs    Gynecologic History Patient's last menstrual period was 12/24/2016 (exact date). Contraception: condoms Last Pap: 10/03/2015. Results were: normal Last mammogram: never had.   Obstetric History OB History  Gravida Para Term Preterm AB Living  0 0 0 0 0 0  SAB TAB Ectopic Multiple Live Births  0 0 0 0         The following portions of the patient's history were reviewed and updated as appropriate: allergies, current medications, past family history, past medical history, past social history, past surgical history and problem list.  Review of Systems Pertinent items are noted in HPI.    Objective:   BP 105/77   Pulse 69   Ht 5\' 1"  (1.549 m)   Wt 108 lb 8 oz (49.2 kg)   LMP 12/24/2016 (Exact Date)   BMI 20.50 kg/m  General Appearance:    Alert, cooperative, no distress, appears stated age  Head:    Normocephalic, without obvious abnormality, atraumatic  Eyes:    conjunctiva/corneas clear, EOM's intact, both eyes  Ears:    Normal external ear canals, both ears  Nose:   Nares normal, septum midline, mucosa normal, no drainage    or sinus tenderness  Throat:   Lips, mucosa, and tongue normal; teeth and gums normal  Neck:   Supple, symmetrical, trachea midline, no adenopathy;    thyroid:  no enlargement/tenderness/nodules  Back:     Symmetric, no curvature, ROM normal, no CVA tenderness  Lungs:     Clear to auscultation bilaterally, respirations unlabored  Chest Wall:    No tenderness or deformity   Heart:    Regular rate and rhythm, S1 and S2 normal, no murmur, rub   or gallop  Breast Exam:    No tenderness, masses, or nipple abnormality  Abdomen:     Soft, non-tender, bowel sounds active all four quadrants,    no masses,  no organomegaly  Genitalia:    Normal female without lesion, discharge or tenderness; enlarged uterus ~12 weeks sized.  Adnexa no masses     Extremities:   Extremities normal, atraumatic, no cyanosis or edema  Pulses:   2+ and symmetric all extremities  Skin:   Skin color, texture, turgor normal, no rashes or lesions    Assessment:    Healthy female exam.   Breast cancer screen- RF nulligravida  STI screen  enlarged uterus   Plan:    Mammogram ordered. Follow up in: 1 year.    F/u cx and STI blood screen Pelvic US to eval enlarged uterus.  Angela [name], M.D., Evern CoreFACOG

## 2017-01-20 ENCOUNTER — Telehealth: Payer: Self-pay | Admitting: Obstetrics & Gynecology

## 2017-01-20 LAB — CERVICOVAGINAL ANCILLARY ONLY
Chlamydia: NEGATIVE
Neisseria Gonorrhea: NEGATIVE
TRICH (WINDOWPATH): NEGATIVE

## 2017-01-20 LAB — HEPATITIS C ANTIBODY: HEP C VIRUS AB: 0.1 {s_co_ratio} (ref 0.0–0.9)

## 2017-01-20 LAB — RPR: RPR Ser Ql: NONREACTIVE

## 2017-01-20 LAB — HIV ANTIBODY (ROUTINE TESTING W REFLEX): HIV SCREEN 4TH GENERATION: NONREACTIVE

## 2017-01-20 LAB — HEPATITIS B SURFACE ANTIGEN: HEP B S AG: NEGATIVE

## 2017-01-20 NOTE — Telephone Encounter (Signed)
I called Angela [name] back and left a message I am calling her back and since I did not reach her to keep her appointment as scheduled unless someone calls her.   I then discussed with [name] , registrar and she said Angela [name] was thinking about rescheduling because of her period and something about thinking she should see the doctor first.

## 2017-01-20 NOTE — Telephone Encounter (Signed)
Patient wants to see about coming before her US to see if she still needs the US. She said you can call her on cell, or at work.

## 2017-01-25 ENCOUNTER — Telehealth: Payer: Self-pay | Admitting: *Deleted

## 2017-01-25 NOTE — Telephone Encounter (Signed)
Pt left message requesting results from labs on 5/30.  I called pt and informed her of all lab results from 5/30.  She voiced understanding and had no additional questions.

## 2017-01-26 ENCOUNTER — Ambulatory Visit (HOSPITAL_COMMUNITY)
Admission: RE | Admit: 2017-01-26 | Discharge: 2017-01-26 | Disposition: A | Discharge status: Home / Self Care | Payer: BLUE CROSS/BLUE SHIELD | Source: Ambulatory Visit | Attending: Obstetrics & Gynecology | Admitting: Obstetrics & Gynecology

## 2017-01-26 DIAGNOSIS — N852 Hypertrophy of uterus: Secondary | ICD-10-CM | POA: Diagnosis not present

## 2017-02-01 NOTE — Telephone Encounter (Signed)
Pt left message requesting results of US. I returned her call and informed her of normal US results - uterus is anteverted which is not abnormal and does not indicate any problem.  Pt voiced understanding.

## 2017-02-04 ENCOUNTER — Ambulatory Visit
Admission: RE | Admit: 2017-02-04 | Discharge: 2017-02-04 | Disposition: A | Discharge status: Home / Self Care | Payer: BLUE CROSS/BLUE SHIELD | Source: Ambulatory Visit | Attending: Obstetrics & Gynecology | Admitting: Obstetrics & Gynecology

## 2017-02-04 DIAGNOSIS — Z1231 Encounter for screening mammogram for malignant neoplasm of breast: Secondary | ICD-10-CM

## 2017-02-08 ENCOUNTER — Telehealth: Payer: Self-pay

## 2017-02-08 NOTE — Telephone Encounter (Signed)
-----   Message from Willodean Rosenthalarolyn Harraway-Smith, MD sent at 02/07/2017  9:59 AM EDT ----- Please call pt. Her US was WNL. Please encourage her to sign up for [facility].  Thx, clh-S

## 2017-02-08 NOTE — Telephone Encounter (Signed)
I have left a detailed message regarding patient normal u/s results.

## 2017-03-03 ENCOUNTER — Encounter: Payer: Self-pay | Admitting: Physician Assistant

## 2017-03-03 ENCOUNTER — Ambulatory Visit (INDEPENDENT_AMBULATORY_CARE_PROVIDER_SITE_OTHER): Payer: BLUE CROSS/BLUE SHIELD | Admitting: Physician Assistant

## 2017-03-03 VITALS — BP 102/62 | HR 80 | Temp 98.0°F | Resp 14 | Ht 61.0 in | Wt 108.4 lb

## 2017-03-03 DIAGNOSIS — J988 Other specified respiratory disorders: Secondary | ICD-10-CM

## 2017-03-03 DIAGNOSIS — B9689 Other specified bacterial agents as the cause of diseases classified elsewhere: Principal | ICD-10-CM

## 2017-03-03 MED ORDER — AZITHROMYCIN 250 MG PO TABS: ORAL_TABLET | ORAL | 0 refills | Status: DC

## 2017-03-03 MED ORDER — FLUTICASONE PROPIONATE 50 MCG/ACT NA SUSP: 2.0000 | Freq: Every day | NASAL | 6 refills | Status: DC

## 2017-03-03 NOTE — Progress Notes (Signed)
    Patient ID: Angela [name] MRN: [id number], DOB: 1976-01-23, 41 y.o. Date of Encounter: 03/03/2017, 8:46 AM    Chief Complaint:  Chief Complaint  Patient presents with  . head congestion    x4days   . chest congestion    green mucus     HPI: [age] y.o. year old female presents with above.   She states that she has been having congestion in her head and nose as well as her chest. Feels very stopped up in her head and nose and is blowing out green mucus. His taking an off brand expectorant which is helping too loose phlegm in her chest. States that she ended up having to stay out of work yesterday and ended up sleeping all day. 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 Notes that last time she used Augmentin and it did cause significant nausea and vomiting and cannot take that. No known fever. No significant sore throat.     Home Meds:   Outpatient Medications Prior to Visit  Medication Sig Dispense Refill  . escitalopram (LEXAPRO) 20 MG tablet Take 1 tablet (20 mg total) by mouth daily. 30 tablet 5   No facility-administered medications prior to visit.     Allergies:  Allergies  Allergen Reactions  . Latex   . Augmentin [Amoxicillin-Pot Clavulanate]     GI Upset  . Codeine       Review of Systems: See HPI for pertinent ROS. All other ROS negative.    Physical Exam: Blood pressure 102/62, pulse 80, temperature 98 F (36.7 C), temperature source Oral, resp. rate 14, height 5\' 1"  (1.549 m), weight 108 lb 6.4 oz (49.2 kg), last menstrual period 02/24/2017, SpO2 98 %., Body mass index is 20.48 kg/m. General: WNWD WF.  Appears in no acute distress. HEENT: Normocephalic, atraumatic, eyes without discharge, sclera non-icteric, nares are without discharge. Bilateral auditory canals clear, TM's are without perforation, pearly grey and translucent with reflective cone of light bilaterally. Oral cavity moist, posterior pharynx without exudate, erythema, peritonsillar  abscess. No tenderness with percussion to frontal or maxillary sinuses bilaterally.  Neck: Supple. No thyromegaly. No lymphadenopathy. Lungs: Clear bilaterally to auscultation without wheezes, rales, or rhonchi. Breathing is unlabored. Heart: Regular rhythm. No murmurs, rubs, or gallops. Msk:  Strength and tone normal for age. Extremities/Skin: Warm and dry.  Neuro: Alert and oriented X 3. Moves all extremities spontaneously. Gait is normal. CNII-XII grossly in tact. Psych:  Responds to questions appropriately with a normal affect.     ASSESSMENT AND PLAN:  41 y.o. year old female with  1. Bacterial respiratory infection She is to take the azithromycin as directed. Can use the Flonase as needed for congestion in nose. Follow-up if symptoms do not resolve within 1 week after completion of azithromycin. Note given to cover out of work Wednesday 03/02/17 through Fri 03/04/17. - azithromycin (ZITHROMAX) 250 MG tablet; Day 1: Take 2 daily. Days 2 -5: Take 1 daily.  Dispense: 6 tablet; Refill: 0 - fluticasone (FLONASE) 50 MCG/ACT nasal spray; Place 2 sprays into both nostrils daily.  Dispense: 16 g; Refill: [address]   Signed, Avyay Coger Beth DellroseDixon, [place], [facility] 03/03/2017 8:46 AM

## 2017-04-28 ENCOUNTER — Telehealth: Payer: Self-pay | Admitting: Physician Assistant

## 2017-04-28 DIAGNOSIS — F419 Anxiety disorder, unspecified: Secondary | ICD-10-CM

## 2017-04-28 MED ORDER — ESCITALOPRAM OXALATE 20 MG PO TABS: 20.0000 mg | ORAL_TABLET | Freq: Every day | ORAL | 5 refills | Status: DC

## 2017-04-28 NOTE — Telephone Encounter (Signed)
Pt needs refill on lexapro, still uses [facility].

## 2017-04-28 NOTE — Telephone Encounter (Signed)
Medication called/sent to requested pharmacy  

## 2017-05-10 ENCOUNTER — Telehealth: Payer: Self-pay

## 2017-05-10 NOTE — Telephone Encounter (Signed)
Patient called requesting a refill on her lexapro. It shows pt received a refill on 9/6 #30 5 refills, however patient states she only received 6 pills. I explained to patient she is due for an office visit, patient is not wanting to sch the office visit she just wants the refill.Explained to patient I would call [facility] to see why they only gave her 6 pills and give her a call bck.

## 2017-05-11 NOTE — Telephone Encounter (Signed)
Patient is aware that her rx was called in on 9/6 and to schedule an appointment before she runs out of refills Patient understands

## 2017-05-30 ENCOUNTER — Encounter: Payer: Self-pay | Admitting: Physician Assistant

## 2017-06-15 ENCOUNTER — Encounter: Payer: Self-pay | Admitting: Physician Assistant

## 2017-06-15 ENCOUNTER — Ambulatory Visit (INDEPENDENT_AMBULATORY_CARE_PROVIDER_SITE_OTHER): Payer: BLUE CROSS/BLUE SHIELD | Admitting: Physician Assistant

## 2017-06-15 VITALS — BP 100/82 | HR 88 | Temp 97.9°F | Resp 16 | Wt 109.4 lb

## 2017-06-15 DIAGNOSIS — J988 Other specified respiratory disorders: Secondary | ICD-10-CM | POA: Diagnosis not present

## 2017-06-15 MED ORDER — AZITHROMYCIN 250 MG PO TABS: ORAL_TABLET | ORAL | 0 refills | Status: DC

## 2017-06-15 NOTE — Progress Notes (Signed)
    Patient ID: Angela [name] MRN: [id number], DOB: [birth year], 41 y.o. Date of Encounter: 06/15/2017, 12:49 PM    Chief Complaint:  Chief Complaint  Patient presents with  . Nasal Congestion  . Otalgia     HPI: 41 y.o. year old female presents with above.  She reports that she has been having congestion in her head and nose as well as her chest. Ears feel congested. Feelss a little bit of soreness in her right lymph node of her neck. Yesterday morning her throat was sore when she woke up but then that resolved and has not reoccurred. No known fevers or chills. She is getting out yellow-green mucus.     Home Meds:   Outpatient Medications Prior to Visit  Medication Sig Dispense Refill  . escitalopram (LEXAPRO) 20 MG tablet Take 1 tablet (20 mg total) by mouth daily. 30 tablet 5  . fluticasone (FLONASE) 50 MCG/ACT nasal spray Place 2 sprays into both nostrils daily. 16 g 6  . azithromycin (ZITHROMAX) 250 MG tablet Day 1: Take 2 daily. Days 2 -5: Take 1 daily. 6 tablet 0   No facility-administered medications prior to visit.     Allergies:  Allergies  Allergen Reactions  . Latex   . Augmentin [Amoxicillin-Pot Clavulanate]     GI Upset  . Codeine       Review of Systems: See HPI for pertinent ROS. All other ROS negative.    Physical Exam: Blood pressure 100/82, pulse 88, temperature 97.9 F (36.6 C), temperature source Oral, resp. rate 16, weight 49.6 kg (109 lb 6.4 oz), last menstrual period 05/28/2017, SpO2 98 %., Body mass index is 20.67 kg/m. General:  WNWD WF. Appears in no acute distress. HEENT: Normocephalic, atraumatic, eyes without discharge, sclera non-icteric, nares are without discharge. Bilateral auditory canals clear, TM's are without perforation, pearly grey and translucent with reflective cone of light bilaterally. Oral cavity moist, posterior pharynx without exudate, erythema, peritonsillar abscess. No tenderness with percussion to frontal or maxillary  sinuses bilaterally.  Neck: Supple. No thyromegaly. She reports minimal tenderness with palpation to the right tonsillar lymph node. No other lymph nodes are tender. No nodes are enlarged. Lungs: Clear bilaterally to auscultation without wheezes, rales, or rhonchi. Breathing is unlabored. Heart: Regular rhythm. No murmurs, rubs, or gallops. Msk:  Strength and tone normal for age. Extremities/Skin: Warm and dry.  Neuro: Alert and oriented X 3. Moves all extremities spontaneously. Gait is normal. CNII-XII grossly in tact. Psych:  Responds to questions appropriately with a normal affect.     ASSESSMENT AND PLAN:  41 y.o. year old female with  1. Respiratory infection She is to take antibiotic as directed. Can also use over-the-counter medications for symptom relief in the interim. Follow-up if symptoms do not resolve within 1 week after completion of antibiotic. Request note to turn into work from being late to work yesterday secondary to her symptoms and also will get note for her to be out today and tomorrow. - azithromycin (ZITHROMAX) 250 MG tablet; Day 1: Take 2 daily. Days 2 -5: Take 1 daily.  Dispense: 6 tablet; Refill: 0   Signed, [address]Mary Beth MarionDixon, GeorgiaPA, [facility] 06/15/2017 12:49 PM

## 2017-09-09 ENCOUNTER — Encounter: Payer: Self-pay | Admitting: Family Medicine

## 2017-09-09 ENCOUNTER — Ambulatory Visit (INDEPENDENT_AMBULATORY_CARE_PROVIDER_SITE_OTHER): Payer: BLUE CROSS/BLUE SHIELD | Admitting: Family Medicine

## 2017-09-09 ENCOUNTER — Encounter: Payer: Self-pay | Admitting: Physician Assistant

## 2017-09-09 VITALS — BP 100/62 | HR 84 | Temp 98.6°F | Resp 14 | Ht 61.0 in | Wt 115.0 lb

## 2017-09-09 DIAGNOSIS — J069 Acute upper respiratory infection, unspecified: Secondary | ICD-10-CM

## 2017-09-09 MED ORDER — BENZONATATE 100 MG PO CAPS: 200.0000 mg | ORAL_CAPSULE | Freq: Three times a day (TID) | ORAL | 0 refills | Status: DC | PRN

## 2017-09-09 NOTE — Progress Notes (Signed)
Subjective:    Patient ID: Angela [name], female    DOB: 12/23/1975, 42 y.o.   MRN: [id number]  HPI Symptoms started with pressure in the ear on Monday. By Wednesday, she had pressure in both ears, nasal congestion, postnasal drip, rhinorrhea, and a tickle cough that keeps her awake at night. She denies any purulent sputum. She denies any chest pain. She denies any shortness of breath. She denies any fevers or chills. She denies flulike myalgias. Past Medical History:  Diagnosis Date  . Allergy    latex and codeine  . Anxiety    on lexapro  . Heavy smoker (more than 20 cigarettes per day) 08/06/2011   No past surgical history on file. Current Outpatient Medications on File Prior to Visit  Medication Sig Dispense Refill  . escitalopram (LEXAPRO) 20 MG tablet Take 1 tablet (20 mg total) by mouth daily. 30 tablet 5  . fluticasone (FLONASE) 50 MCG/ACT nasal spray Place 2 sprays into both nostrils daily. 16 g 6   No current facility-administered medications on file prior to visit.    Allergies  Allergen Reactions  . Latex   . Augmentin [Amoxicillin-Pot Clavulanate]     GI Upset  . Codeine    Social History   Socioeconomic History  . Marital status: Single    Spouse name: Not on file  . Number of children: Not on file  . Years of education: Not on file  . Highest education level: Not on file  Social Needs  . Financial resource strain: Not on file  . Food insecurity - worry: Not on file  . Food insecurity - inability: Not on file  . Transportation needs - medical: Not on file  . Transportation needs - non-medical: Not on file  Occupational History  . Not on file  Tobacco Use  . Smoking status: Current Every Day Smoker    Packs/day: 1.00    Years: 15.00    Pack years: 15.00    Types: Cigarettes  . Smokeless tobacco: Never Used  . Tobacco comment: trying to cut back and quit  Substance and Sexual Activity  . Alcohol use: Yes    Comment: rarely  . Drug use: No  .  Sexual activity: Yes    Birth control/protection: None  Other Topics Concern  . Not on file  Social History Narrative  . Not on file      Review of Systems  All other systems reviewed and are negative.      Objective:   Physical Exam  Constitutional: She appears well-developed and well-nourished. No distress.  HENT:  Right Ear: External ear normal.  Left Ear: External ear normal.  Nose: Nose normal.  Mouth/Throat: Oropharynx is clear and moist. No oropharyngeal exudate.  Eyes: Conjunctivae are normal.  Neck: Neck supple.  Cardiovascular: Normal rate, regular rhythm and normal heart sounds. Exam reveals no gallop and no friction rub.  No murmur heard. Pulmonary/Chest: Effort normal and breath sounds normal. No respiratory distress. She has no wheezes. She has no rales.  Abdominal: Soft. Bowel sounds are normal.  Lymphadenopathy:    She has no cervical adenopathy.  Skin: She is not diaphoretic.          Assessment & Plan:  Viral URI  Patient's exam looks like a mild upper respiratory infection/cold. I have recommended tincture of time. Use Tessalon 200 mg every 8 hours as needed for cough. She can use Norel AD 1 tablet every 6 hours as needed for nasal  congestion, cough, etc.

## 2017-10-31 ENCOUNTER — Ambulatory Visit: Payer: BLUE CROSS/BLUE SHIELD | Admitting: Physician Assistant

## 2017-10-31 ENCOUNTER — Encounter: Payer: Self-pay | Admitting: Physician Assistant

## 2017-10-31 ENCOUNTER — Other Ambulatory Visit: Payer: Self-pay

## 2017-10-31 ENCOUNTER — Ambulatory Visit (INDEPENDENT_AMBULATORY_CARE_PROVIDER_SITE_OTHER): Payer: BLUE CROSS/BLUE SHIELD | Admitting: Family Medicine

## 2017-10-31 ENCOUNTER — Encounter: Payer: Self-pay | Admitting: Family Medicine

## 2017-10-31 VITALS — BP 112/68 | HR 80 | Temp 98.5°F | Resp 14 | Ht 61.0 in | Wt 115.0 lb

## 2017-10-31 DIAGNOSIS — B379 Candidiasis, unspecified: Secondary | ICD-10-CM | POA: Diagnosis not present

## 2017-10-31 DIAGNOSIS — N949 Unspecified condition associated with female genital organs and menstrual cycle: Secondary | ICD-10-CM | POA: Diagnosis not present

## 2017-10-31 DIAGNOSIS — N898 Other specified noninflammatory disorders of vagina: Secondary | ICD-10-CM

## 2017-10-31 LAB — URINALYSIS, ROUTINE W REFLEX MICROSCOPIC
Bilirubin Urine: NEGATIVE
Glucose, UA: NEGATIVE
Nitrite: NEGATIVE
PH: 7 (ref 5.0–8.0)
Protein, ur: NEGATIVE
SPECIFIC GRAVITY, URINE: 1.02 (ref 1.001–1.03)

## 2017-10-31 LAB — WET PREP FOR TRICH, YEAST, CLUE

## 2017-10-31 LAB — MICROSCOPIC MESSAGE

## 2017-10-31 MED ORDER — FLUCONAZOLE 150 MG PO TABS: 150.0000 mg | ORAL_TABLET | Freq: Once | ORAL | 1 refills | Status: AC

## 2017-10-31 NOTE — Progress Notes (Signed)
   Subjective:    Patient ID: Angela [name], female    DOB: 10-28-1975, 42 y.o.   MRN: [id number]  Patient presents for Vaginitis (itching and burning in vagnal area- would like STD check)  Patient here with vaginal irritation mild discharge for the past week or so.  Initially she had some irritation at the using a latex condom she then switched to a nonlatex condoms however she does have a new partner would like STD screening.  She is also had some mild burning with urination on and off denies any blood in the urine.  Her menstrual cycle has been regular it is due on March 15.  She has used Monistat for the past 2 days.  This has helped some of her symptoms.  Denies any abdominal pain no nausea vomiting    Review Of Systems:  GEN- denies fatigue, fever, weight loss,weakness, recent illness HEENT- denies eye drainage, change in vision, nasal discharge, CVS- denies chest pain, palpitations RESP- denies SOB, cough, wheeze ABD- denies N/V, change in stools, abd pain GU- denies dysuria, hematuria, dribbling, incontinence MSK- denies joint pain, muscle aches, injury Neuro- denies headache, dizziness, syncope, seizure activity       Objective:    BP 112/68   Pulse 80   Temp 98.5 F (36.9 C) (Oral)   Resp 14   Ht 5\' 1"  (1.549 m)   Wt 115 lb (52.2 kg)   SpO2 96%   BMI 21.73 kg/m  GEN- NAD, alert and oriented x3 CVS- RRR, no murmur RESP-CTAB ABD-NABS,soft,NT,ND, no CVA tenderness GU- normal external genitalia, vaginal mucosa pink and moist, cervix visualized no growth, no blood form os, + white discharge vs monistat, no CMT, no ovarian masses, uterus normal size        Assessment & Plan:      Problem List Items Addressed This Visit    None    Visit Diagnoses    Yeast infection    -  Primary   Treat with diflucan, STD screen done, pt declines HIV testing. For intermittant dysuria, UA/Culture sent treat if true UTI   Relevant Medications   fluconazole (DIFLUCAN) 150 MG  tablet   Other Relevant Orders   Urinalysis, Routine w reflex microscopic (Completed)   Urine Culture   Itching in the vaginal area       Relevant Orders   Urinalysis, Routine w reflex microscopic (Completed)   C. trachomatis/N. gonorrhoeae RNA   WET PREP FOR TRICH, YEAST, CLUE (Completed)   Urine Culture      Note: This dictation was prepared with Dragon dictation along with smaller phrase technology. Any transcriptional errors that result from this process are unintentional.

## 2017-10-31 NOTE — Patient Instructions (Signed)
F/U pending results Take the diflucan

## 2017-11-01 LAB — C. TRACHOMATIS/N. GONORRHOEAE RNA
C. TRACHOMATIS RNA, TMA: NOT DETECTED
N. gonorrhoeae RNA, TMA: NOT DETECTED

## 2017-11-03 LAB — URINE CULTURE
MICRO NUMBER: 90308971
SPECIMEN QUALITY: ADEQUATE

## 2018-03-16 ENCOUNTER — Ambulatory Visit (INDEPENDENT_AMBULATORY_CARE_PROVIDER_SITE_OTHER): Payer: BLUE CROSS/BLUE SHIELD | Admitting: Obstetrics & Gynecology

## 2018-03-16 ENCOUNTER — Encounter: Payer: Self-pay | Admitting: Obstetrics & Gynecology

## 2018-03-16 ENCOUNTER — Encounter

## 2018-03-16 VITALS — BP 105/74 | HR 74 | Wt 113.5 lb

## 2018-03-16 DIAGNOSIS — Z1151 Encounter for screening for human papillomavirus (HPV): Secondary | ICD-10-CM

## 2018-03-16 DIAGNOSIS — Z124 Encounter for screening for malignant neoplasm of cervix: Secondary | ICD-10-CM | POA: Diagnosis not present

## 2018-03-16 DIAGNOSIS — Z113 Encounter for screening for infections with a predominantly sexual mode of transmission: Secondary | ICD-10-CM

## 2018-03-16 DIAGNOSIS — Z01419 Encounter for gynecological examination (general) (routine) without abnormal findings: Secondary | ICD-10-CM

## 2018-03-16 DIAGNOSIS — Z1231 Encounter for screening mammogram for malignant neoplasm of breast: Secondary | ICD-10-CM

## 2018-03-16 NOTE — Progress Notes (Signed)
Mammogram scheduled for August 16th @ 1600.  Pt notified.

## 2018-03-16 NOTE — Patient Instructions (Signed)
Mammogram A mammogram is an X-ray of the breasts that is done to check for abnormal changes. This procedure can screen for and detect any changes that may suggest breast cancer. A mammogram can also identify other changes and variations in the breast, such as:  Inflammation of the breast tissue (mastitis).  An infected area that contains a collection of pus (abscess).  A fluid-filled sac (cyst).  Fibrocystic changes. This is when breast tissue becomes denser, which can make the tissue feel rope-like or uneven under the skin.  Tumors that are not cancerous (benign).  Tell a health care provider about:  Any allergies you have.  If you have breast implants.  If you have had previous breast disease, biopsy, or surgery.  If you are breastfeeding.  Any possibility that you could be pregnant, if this applies.  If you are younger than age 25.  If you have a family history of breast cancer. What are the risks? Generally, this is a safe procedure. However, problems may occur, including:  Exposure to radiation. Radiation levels are very low with this test.  The results being misinterpreted.  The need for further tests.  The inability of the mammogram to detect certain cancers.  What happens before the procedure?  Schedule your test about 1-2 weeks after your menstrual period. This is usually when your breasts are the least tender.  If you have had a mammogram done at a different facility in the past, get the mammogram X-rays or have them sent to your current exam facility in order to compare them.  Wash your breasts and under your arms the day of the test.  Do not wear deodorants, perfumes, lotions, or powders anywhere on your body on the day of the test.  Remove any jewelry from your neck.  Wear clothes that you can change into and out of easily. What happens during the procedure?  You will undress from the waist up and put on a gown.  You will stand in front of the  X-ray machine.  Each breast will be placed between two plastic or glass plates. The plates will compress your breast for a few seconds. Try to stay as relaxed as possible during the procedure. This does not cause any harm to your breasts and any discomfort you feel will be very brief.  X-rays will be taken from different angles of each breast. The procedure may vary among health care providers and hospitals. What happens after the procedure?  The mammogram will be examined by a specialist (radiologist).  You may need to repeat certain parts of the test, depending on the quality of the images. This is commonly done if the radiologist needs a better view of the breast tissue.  Ask when your test results will be ready. Make sure you get your test results.  You may resume your normal activities. This information is not intended to replace advice given to you by your health care provider. Make sure you discuss any questions you have with your health care provider. Document Released: 08/06/2000 Document Revised: 01/12/2016 Document Reviewed: 10/18/2014 Elsevier Interactive Patient Education  2018 Elsevier Inc.  

## 2018-03-16 NOTE — Progress Notes (Signed)
Subjective:     Angela [name] is a 42 y.o. female here for a routine exam. LMP 02/25/2018 Current complaints: no GYN problems.  Sexually active.    Gynecologic History No LMP recorded. Contraception: condoms Last Pap: 10/03/2015. Results were: normal. Neg hrHPV Last mammogram: 02/04/2017. Results were: normal  Obstetric History OB History  Gravida Para Term Preterm AB Living  0 0 0 0 0 0  SAB TAB Ectopic Multiple Live Births  0 0 0 0      The following portions of the patient's history were reviewed and updated as appropriate: allergies, current medications, past family history, past medical history, past social history, past surgical history and problem list.  Review of Systems Pertinent items are noted in HPI.    Objective:  BP 105/74   Pulse 74   Wt 113 lb 8 oz (51.5 kg)   BMI 21.45 kg/m   General Appearance:    Alert, cooperative, no distress, appears stated age  Head:    Normocephalic, without obvious abnormality, atraumatic  Eyes:    conjunctiva/corneas clear, EOM's intact, both eyes  Ears:    Normal external ear canals, both ears  Nose:   Nares normal, septum midline, mucosa normal, no drainage    or sinus tenderness  Throat:   Lips, mucosa, and tongue normal; teeth and gums normal  Neck:   Supple, symmetrical, trachea midline, no adenopathy;    thyroid:  no enlargement/tenderness/nodules  Back:     Symmetric, no curvature, ROM normal, no CVA tenderness  Lungs:     Clear to auscultation bilaterally, respirations unlabored  Chest Wall:    No tenderness or deformity   Heart:    Regular rate and rhythm, S1 and S2 normal, no murmur, rub   or gallop  Breast Exam:    No tenderness, masses, or nipple abnormality  Abdomen:     Soft, non-tender, bowel sounds active all four quadrants,    no masses, no organomegaly  Genitalia:    Normal female without lesion, discharge or tenderness     Extremities:   Extremities normal, atraumatic, no cyanosis or edema  Pulses:   2+ and  symmetric all extremities  Skin:   Skin color, texture, turgor normal, no rashes or lesions     01/26/2017 CLINICAL DATA:  Prominent uterus on physical examination  EXAM: TRANSABDOMINAL AND TRANSVAGINAL ULTRASOUND OF PELVIS  TECHNIQUE: Study was performed transabdominally to optimize pelvic field of view evaluation and transvaginally to optimize internal visceral architecture evaluation.  COMPARISON:  None  FINDINGS: Uterus  Measurements: 8.9 x 4.1 x 5.5 cm. No fibroids or other mass visualized. Uterus is anteverted.  Endometrium  Thickness: 2 mm.  No focal abnormality visualized.  Right ovary  Measurements: 3.1 x 1.8 x 1.9 cm. Normal appearance/no adnexal mass.  Left ovary  Measurements: 2.7 x 1.7 x 1.9 cm. Normal appearance/no adnexal mass.  Other findings  No abnormal free fluid.  IMPRESSION: Study within normal limits.   Assessment:    Healthy female exam.   STI screening   Plan:    Mammogram ordered. Follow up in: 1 year.    F/u PAP and cx. Labs: HIV, RPR, Hep B & C  Angela [name], M.D., Evern CoreFACOG

## 2018-03-17 LAB — HEPATITIS C ANTIBODY: HEP C VIRUS AB: 0.1 {s_co_ratio} (ref 0.0–0.9)

## 2018-03-17 LAB — RPR: RPR Ser Ql: NONREACTIVE

## 2018-03-17 LAB — HEPATITIS B SURFACE ANTIGEN: Hepatitis B Surface Ag: NEGATIVE

## 2018-03-17 LAB — HIV ANTIBODY (ROUTINE TESTING W REFLEX): HIV Screen 4th Generation wRfx: NONREACTIVE

## 2018-03-23 ENCOUNTER — Telehealth: Payer: Self-pay | Admitting: *Deleted

## 2018-03-23 NOTE — Telephone Encounter (Addendum)
Pt left message requesting results from visit on 7/25. She stated that a detailed message can be left on her voicemail if she does not answer because she is @ work.   8/5  1949  Called pt and informed her of Pap results and need for Colpo.  Her questions were answered to her satisfaction. She will be contacted with appointment information. Pt voiced understanding.

## 2018-03-24 LAB — CYTOLOGY - PAP
Adequacy: ABSENT — AB
CHLAMYDIA, DNA PROBE: NEGATIVE
HPV 16/18/45 genotyping: NEGATIVE
HPV: DETECTED — AB
NEISSERIA GONORRHEA: NEGATIVE
TRICH (WINDOWPATH): NEGATIVE

## 2018-03-26 ENCOUNTER — Encounter: Payer: Self-pay | Admitting: Obstetrics & Gynecology

## 2018-03-26 DIAGNOSIS — R87619 Unspecified abnormal cytological findings in specimens from cervix uteri: Secondary | ICD-10-CM | POA: Insufficient documentation

## 2018-04-05 ENCOUNTER — Ambulatory Visit
Admission: RE | Admit: 2018-04-05 | Discharge: 2018-04-05 | Disposition: A | Discharge status: Home / Self Care | Payer: BLUE CROSS/BLUE SHIELD | Source: Ambulatory Visit | Attending: Obstetrics & Gynecology | Admitting: Obstetrics & Gynecology

## 2018-04-05 DIAGNOSIS — Z1231 Encounter for screening mammogram for malignant neoplasm of breast: Secondary | ICD-10-CM

## 2018-04-07 ENCOUNTER — Ambulatory Visit: Payer: BLUE CROSS/BLUE SHIELD

## 2018-04-27 ENCOUNTER — Encounter: Payer: Self-pay | Admitting: Physician Assistant

## 2018-04-27 ENCOUNTER — Ambulatory Visit: Payer: BLUE CROSS/BLUE SHIELD | Admitting: Physician Assistant

## 2018-04-27 ENCOUNTER — Other Ambulatory Visit: Payer: Self-pay

## 2018-04-27 VITALS — BP 102/72 | HR 73 | Temp 98.1°F | Resp 16 | Ht 61.0 in | Wt 114.2 lb

## 2018-04-27 DIAGNOSIS — F17210 Nicotine dependence, cigarettes, uncomplicated: Secondary | ICD-10-CM

## 2018-04-27 DIAGNOSIS — F419 Anxiety disorder, unspecified: Secondary | ICD-10-CM

## 2018-04-27 NOTE — Progress Notes (Signed)
Patient ID: Angela [name] MRN: [id number], DOB: Nov 25, 1975, 42 y.o. Date of Encounter: @DATE @  Chief Complaint:  Chief Complaint  Patient presents with  . Anxiety    HPI: 42 y.o. year old female  presents for routine follow-up regarding unrealized anxiety disorder, to refill Lexapro.  She reports that she can tell that the Lexapro is definitely helping and is working well at keeping her anxiety controlled. She states that a couple years ago she had several things ----her dad passed away, her mom was diagnosed and treated for breast cancer, and her kitty cat that she had had for 10 years died.  However she states that she feels that she handled these pretty well and definitely thinks that the Lexapro did help as well. She states that she feels that overall things have been pretty stable. She does report that she recently went to GYN for her annual GYN exam and they informed her that Pap smear was abnormal so she does have follow-up with them next week. Dates that she is having annual mammogram now that she is greater than 40 and since her mother had breast cancer.  Also reviewed that at her visit with me 05/13/2016 I have discussed smoking cessation that she was did not feel that she was ready to quit at that time.  That she would follow-up if she was "ready to quit ".  Brought this up again today.  However she states that she really had not given this much thoughts and does not think that she is mentally ready to quit.  She has no other concerns to address.   Past Medical History:  Diagnosis Date  . Allergy    latex and codeine  . Anxiety    on lexapro  . Heavy smoker (more than 20 cigarettes per day) 08/06/2011     Home Meds: Outpatient Medications Prior to Visit  Medication Sig Dispense Refill  . escitalopram (LEXAPRO) 20 MG tablet Take 1 tablet (20 mg total) by mouth daily. 30 tablet 5   No facility-administered medications prior to visit.     Allergies:  Allergies   Allergen Reactions  . Latex   . Augmentin [Amoxicillin-Pot Clavulanate]     GI Upset  . Codeine     Social History   Socioeconomic History  . Marital status: Single    Spouse name: Not on file  . Number of children: Not on file  . Years of education: Not on file  . Highest education level: Not on file  Occupational History  . Not on file  Social Needs  . Financial resource strain: Not on file  . Food insecurity:    Worry: Not on file    Inability: Not on file  . Transportation needs:    Medical: Not on file    Non-medical: Not on file  Tobacco Use  . Smoking status: Current Every Day Smoker    Packs/day: 1.00    Years: 15.00    Pack years: 15.00    Types: Cigarettes  . Smokeless tobacco: Never Used  . Tobacco comment: trying to cut back and quit  Substance and Sexual Activity  . Alcohol use: Yes    Comment: rarely  . Drug use: No  . Sexual activity: Yes    Birth control/protection: None  Lifestyle  . Physical activity:    Days per week: Not on file    Minutes per session: Not on file  . Stress: Not on file  Relationships  .  Social connections:    Talks on phone: Not on file    Gets together: Not on file    Attends religious service: Not on file    Active member of club or organization: Not on file    Attends meetings of clubs or organizations: Not on file    Relationship status: Not on file  . Intimate partner violence:    Fear of current or ex partner: Not on file    Emotionally abused: Not on file    Physically abused: Not on file    Forced sexual activity: Not on file  Other Topics Concern  . Not on file  Social History Narrative  . Not on file    Family History  Problem Relation Age of Onset  . Cancer Maternal Grandmother        throat  . Cancer Maternal Grandfather        prostate  . Breast cancer Mother 57     Review of Systems:  See HPI for pertinent ROS. All other ROS negative.    Physical Exam: Blood pressure 102/72, pulse 73,  temperature 98.1 F (36.7 C), temperature source Oral, resp. rate 16, height 5\' 1"  (1.549 m), weight 51.8 kg, last menstrual period 03/28/2018, SpO2 97 %., Body mass index is 21.58 kg/m. General: WNWD WF. Appears in no acute distress. Neck: Supple. No thyromegaly. No lymphadenopathy. Lungs: Clear bilaterally to auscultation without wheezes, rales, or rhonchi. Breathing is unlabored. Heart: RRR with S1 S2. No murmurs, rubs, or gallops. Musculoskeletal:  Strength and tone normal for age. Extremities/Skin: Warm and dry.  Neuro: Alert and oriented X 3. Moves all extremities spontaneously. Gait is normal. CNII-XII grossly in tact. Psych:  Responds to questions appropriately with a normal affect.     ASSESSMENT AND PLAN:  42 y.o. year old female with   1. Anxiety This is well controlled with Lexapro.  Continue current dose of Lexapro.  If things remain stable can wait 1 year for follow-up.  Follow-up sooner if needed.  2. Heavy smoker (more than 20 cigarettes per day) Discussed need for smoking cessation again today but she still is not ready to quit.  She is well aware of medical complications related to continued smoking.  Discussed preventive care.  She is seeing gynecologist routinely and is having follow-up of an abnormal Pap smear and has been having annual mammograms.  She states that her gynecologist also does some lab work so this has been checked there.  Therefore can wait to schedule CPEs here as she approaches age 66.   [address] [place], [place], [facility] 04/27/2018 4:25 PM

## 2018-04-28 ENCOUNTER — Other Ambulatory Visit: Payer: Self-pay | Admitting: Physician Assistant

## 2018-04-28 DIAGNOSIS — F419 Anxiety disorder, unspecified: Secondary | ICD-10-CM

## 2018-05-08 ENCOUNTER — Other Ambulatory Visit (HOSPITAL_COMMUNITY)
Admission: RE | Admit: 2018-05-08 | Discharge: 2018-05-08 | Disposition: A | Discharge status: Home / Self Care | Payer: BLUE CROSS/BLUE SHIELD | Source: Ambulatory Visit | Attending: Obstetrics & Gynecology | Admitting: Obstetrics & Gynecology

## 2018-05-08 ENCOUNTER — Encounter: Payer: Self-pay | Admitting: Obstetrics & Gynecology

## 2018-05-08 ENCOUNTER — Ambulatory Visit: Payer: BLUE CROSS/BLUE SHIELD | Admitting: Obstetrics & Gynecology

## 2018-05-08 VITALS — BP 110/70 | HR 95 | Wt 114.5 lb

## 2018-05-08 DIAGNOSIS — N870 Mild cervical dysplasia: Secondary | ICD-10-CM | POA: Insufficient documentation

## 2018-05-08 LAB — POCT PREGNANCY, URINE: Preg Test, Ur: NEGATIVE

## 2018-05-08 NOTE — Patient Instructions (Signed)
Colposcopy  Colposcopy is a procedure to examine the lowest part of the uterus (cervix), the vagina, and the area around the vaginal opening (vulva) for abnormalities or signs of disease. The procedure is done using a lighted microscope or magnifying lens (colposcope). If any unusual cells are found during the procedure, your health care provider may remove a tissue sample for testing (biopsy). A colposcopy may be done if you:   Have an abnormal Pap test. A Pap test is a screening test that is used to check for signs of cancer or infection of the vagina, cervix, and uterus.   Have a Pap smear test in which you test positive for high-risk HPV (human papillomavirus).   Have a sore or lesion on your cervix.   Have genital warts on your vulva, vagina, or cervix.   Took certain medicines while pregnant, such as diethylstilbestrol (DES).   Have pain during sexual intercourse.   Have vaginal bleeding, especially after sexual intercourse.   Need to have a cervical polyp removed.   Need to have a lost intrauterine device (IUD) string located.    Let your health care provider know about:   Any allergies you have, including allergies to prescribed medicine, latex, or iodine.   All medicines you are taking, including vitamins, herbs, eye drops, creams, and over-the-counter medicines. Bring a list of all of your medicines to your appointment.   Any problems you or family members have had with anesthetic medicines.   Any blood disorders you have.   Any surgeries you have had.   Any medical conditions you have, such as pelvic inflammatory disease (PID) or endometrial disorder.   Any history of frequent fainting.   Your menstrual cycle and what form of birth control (contraception) you use.   Your medical history, including any prior cervical treatment.   Whether you are pregnant or may be pregnant.  What are the risks?  Generally, this is a safe procedure. However, problems may occur,  including:   Pain.   Infection, which may include a fever, bad-smelling discharge, or pelvic pain.   Bleeding or discharge.   Misdiagnosis.   Fainting and vasovagal reactions, but this is rare.   Allergic reactions to medicines.   Damage to other structures or organs.    What happens before the procedure?   If you have your menstrual period or will have it at the time of your procedure, tell your health care provider. A colposcopy typically is not done during menstruation.   Continue your contraceptive practices before and after the procedure.   For 24 hours before the colposcopy:  ? Do not douche.  ? Do not use tampons.  ? Do not use medicines, creams, or suppositories in the vagina.  ? Do not have sexual intercourse.   Ask your health care provider about:  ? Changing or stopping your regular medicines. This is especially important if you are taking diabetes medicines or blood thinners.  ? Taking medicines such as aspirin and ibuprofen. These medicines can thin your blood. Do not take these medicines before your procedure if your health care provider instructs you not to. It is likely that your health care provider will tell you to avoid taking aspirin or medicine that contains aspirin for 7 days before the procedure.   Follow instructions from your health care provider about eating or drinking restrictions. You will likely need to eat a regular diet the day of the procedure and not skip any meals.   You   may have an exam or testing. A pregnancy test will be taken on the day of the procedure.   You may have a blood or urine sample taken.   Plan to have someone take you home from the hospital or clinic.   If you will be going home right after the procedure, plan to have someone with you for 24 hours.  What happens during the procedure?   You will lie down on your back, with your feet in foot rests (stirrups).   A warmed and lubricated instrument (speculum) will be inserted into your vagina. The  speculum will be used to hold apart the walls of your vagina so your health care provider can see your cervix and the inside of your vagina.   A cotton swab will be used to place a small amount of liquid solution on the areas to be examined. This solution makes it easier to see abnormal cells. You may feel a slight burning during this part.   The colposcope will be used to scan the cervix with a bright white light. The colposcope will be held near your vulvaand will magnify your vulva, vagina, and cervix for easier examination.   Your health care provider may decide to take a biopsy. If so:  ? You may be given medicine to numb the area (local anesthetic).  ? Surgical instruments will be used to suck out mucus and cells through your vagina.  ? You may feel mild pain while the tissue sample is removed.  ? Bleeding may occur. A solution may be used to stop the bleeding.  ? If a sample of tissue is needed from the inside of the cervix, a different procedure called endocervical curettage (ECC) may be completed. During this procedure, a curved instrument (curette) will be used to scrape cells from your cervix or the top of your cervix (endocervix).   Your health care provider will record the location of any abnormalities.  The procedure may vary among health care providers and hospitals.  What happens after the procedure?   You will lie down and rest for a few minutes. You may be offered juice or cookies.   Your blood pressure, heart rate, breathing rate, and blood oxygen level will be monitored until any medicines you were given have worn off.   You may have to wear compression stockings. These stockings help to prevent blood clots and reduce swelling in your legs.   You may have some cramping in your abdomen. This should go away after a few minutes.  This information is not intended to replace advice given to you by your health care provider. Make sure you discuss any questions you have with your health care  provider.  Document Released: 10/30/2002 Document Revised: 04/06/2016 Document Reviewed: 03/15/2016  Elsevier Interactive Patient Education  2018 Elsevier Inc.

## 2018-05-08 NOTE — Progress Notes (Signed)
Patient given informed consent, signed copy in the chart, time out was performed.  Placed in lithotomy position. Cervix viewed with speculum and colposcope after application of acetic acid.  03/16/2018 Component 40mo ago  Adequacy Satisfactory for evaluation endocervical/transformation zone component ABSENT.Abnormal  VC  Diagnosis LOW GRADE SQUAMOUS INTRAEPITHELIAL LESION: CIN-1/ HPV (LSIL).Abnormal  VC  HPV 16/18/45 genotyping NEGATIVE for HPV 16 & 18/45 VC  Comment: Normal Reference Range - Negative  Chlamydia Negative VC  Comment: Normal Reference Range - Negative  Neisseria gonorrhea Negative VC  Comment: Normal Reference Range - Negative  HPV DETECTEDAbnormal  VC  Comment: Normal Reference Range - NOT Detected  Trichomonas Negative VC  Comment: Normal Reference Range - Negative    Colposcopy adequate?  yes Acetowhite lesions? no Punctation? no Mosaicism?  no Abnormal vasculature?  no Biopsies? no ECC? Yes There were 2 Nabothian cysts noted.    Patient was given post procedure instructions.  She will return in 1 year or sooner prn.  Will review results via telephone or EPIC     Akshaya Toepfer L. Harraway-Smith, M.D., Evern CoreFACOG

## 2018-05-09 ENCOUNTER — Telehealth: Payer: Self-pay

## 2018-05-09 NOTE — Telephone Encounter (Signed)
Pt called on Nurse Line 05/08/18 stating that she had Colpo done  05/08/18 with Dr. Erin FullingHarraway-Smith and she had questions that she did not get to ask her. Request call back please.

## 2018-05-10 ENCOUNTER — Encounter: Payer: Self-pay | Admitting: *Deleted

## 2018-05-10 ENCOUNTER — Telehealth: Payer: Self-pay | Admitting: General Practice

## 2018-05-10 NOTE — Telephone Encounter (Signed)
Patient called into front office stating she was distracted during the colposcopy and forgot what Dr Erin FullingHarraway Smith said about HPV. Patient states she has been worried about it since then. Explained to patient HPV, mode of transmission, discussed virus can lie dormant in the body for years & can come and go on pap smears, discussed it's nothing to worry about & the most important thing she can do is stay on top of our recommended follow ups. Patient verbalized understanding to all & had no questions at this time.

## 2018-05-11 ENCOUNTER — Telehealth: Payer: Self-pay | Admitting: Obstetrics & Gynecology

## 2018-05-11 NOTE — Telephone Encounter (Signed)
TC to pt. Returned her call for post procedure questions. No answer left message. Gave pt results for colpo bx which revealed low grade dysplasia. Needs repeat PAP in 1 year.   She was invited to call with further questions or concerns,   Eber JonesCarolyn L. Harraway-Smith, M.D., Evern CoreFACOG

## 2019-02-05 ENCOUNTER — Telehealth: Payer: Self-pay | Admitting: Obstetrics & Gynecology

## 2019-02-05 NOTE — Telephone Encounter (Signed)
Patient called to get her annual exam. I informed her Dr Ihor Dow would not be seeing patients in the office for now. I asked her if she wanted to see somebody else, and she stated she really wanted to see Dr Ihor Dow. I called the office, and transferred the patient.

## 2019-02-12 DIAGNOSIS — R5381 Other malaise: Secondary | ICD-10-CM | POA: Diagnosis not present

## 2019-02-12 DIAGNOSIS — R739 Hyperglycemia, unspecified: Secondary | ICD-10-CM | POA: Diagnosis not present

## 2019-03-07 ENCOUNTER — Other Ambulatory Visit: Payer: Self-pay | Admitting: Obstetrics & Gynecology

## 2019-03-07 DIAGNOSIS — Z1231 Encounter for screening mammogram for malignant neoplasm of breast: Secondary | ICD-10-CM

## 2019-03-12 ENCOUNTER — Other Ambulatory Visit: Payer: Self-pay

## 2019-03-12 ENCOUNTER — Ambulatory Visit (INDEPENDENT_AMBULATORY_CARE_PROVIDER_SITE_OTHER): Payer: BC Managed Care – PPO | Admitting: Obstetrics & Gynecology

## 2019-03-12 ENCOUNTER — Encounter: Payer: Self-pay | Admitting: Obstetrics & Gynecology

## 2019-03-12 VITALS — BP 105/71 | HR 72 | Ht 61.0 in | Wt 111.0 lb

## 2019-03-12 DIAGNOSIS — Z1151 Encounter for screening for human papillomavirus (HPV): Secondary | ICD-10-CM

## 2019-03-12 DIAGNOSIS — B9689 Other specified bacterial agents as the cause of diseases classified elsewhere: Secondary | ICD-10-CM | POA: Diagnosis not present

## 2019-03-12 DIAGNOSIS — Z124 Encounter for screening for malignant neoplasm of cervix: Secondary | ICD-10-CM | POA: Diagnosis not present

## 2019-03-12 DIAGNOSIS — Z8742 Personal history of other diseases of the female genital tract: Secondary | ICD-10-CM

## 2019-03-12 DIAGNOSIS — Z3009 Encounter for other general counseling and advice on contraception: Secondary | ICD-10-CM

## 2019-03-12 DIAGNOSIS — N760 Acute vaginitis: Secondary | ICD-10-CM | POA: Diagnosis not present

## 2019-03-12 DIAGNOSIS — Z01419 Encounter for gynecological examination (general) (routine) without abnormal findings: Secondary | ICD-10-CM | POA: Diagnosis not present

## 2019-03-12 DIAGNOSIS — Z113 Encounter for screening for infections with a predominantly sexual mode of transmission: Secondary | ICD-10-CM | POA: Diagnosis not present

## 2019-03-12 DIAGNOSIS — N898 Other specified noninflammatory disorders of vagina: Secondary | ICD-10-CM

## 2019-03-12 DIAGNOSIS — Z1239 Encounter for other screening for malignant neoplasm of breast: Secondary | ICD-10-CM

## 2019-03-12 NOTE — Progress Notes (Signed)
Subjective:     Angela [name] is a 43 y.o. female here for a routine exam.  Current complaints: Pt has transferred.  New sexual partner with condoms only Desires STI screen. Was seen in the urgent care in June for elevated glucose.     Gynecologic History Patient's last menstrual period was 03/04/2019. Contraception: condoms Last Pap: 03/16/2018   Satisfactory for evaluation endocervical/transformation zone component ABSENT.Abnormal  VC    Diagnosis LOW GRADE SQUAMOUS INTRAEPITHELIAL LESION: CIN-1/ HPV (LSIL).Abnormal  VC   HPV 16/18/45 genotyping NEGATIVE for HPV 16 & 18/45 VC   Comment: Normal Reference Range - Negative  Chlamydia Negative VC   Comment: Normal Reference Range - Negative  Neisseria gonorrhea Negative VC   Comment: Normal Reference Range - Negative  HPV DETECTEDAbnormal  VC   Comment: Normal Reference Range - NOT Detected   Last mammogram: 04/05/2018. Results were: normal  Obstetric History OB History  Gravida Para Term Preterm AB Living  0 0 0 0 0 0  SAB TAB Ectopic Multiple Live Births  0 0 0 0       The following portions of the patient's history were reviewed and updated as appropriate: allergies, current medications, past family history, past medical history, past social history, past surgical history and problem list.  Review of Systems Pertinent items are noted in HPI.    Objective:  BP 105/71   Pulse 72   Ht _0  (1.549 m)   Wt 111 lb 0.6 oz (50.4 kg)   LMP 03/04/2019   BMI 20.98 kg/m   General Appearance:    Alert, cooperative, no distress, appears stated age  Head:    Normocephalic, without obvious abnormality, atraumatic  Eyes:    conjunctiva/corneas clear, EOM's intact, both eyes  Ears:    Normal external ear canals, both ears  Nose:   Nares normal, septum midline, mucosa normal, no drainage    or sinus tenderness  Throat:   Lips, mucosa, and tongue normal; teeth and gums normal  Neck:   Supple, symmetrical, trachea midline, no  adenopathy;    thyroid:  no enlargement/tenderness/nodules  Back:     Symmetric, no curvature, ROM normal, no CVA tenderness  Lungs:     respirations unlabored  Chest Wall:    No tenderness or deformity   Heart:    Regular rate and rhythm  Breast Exam:    No tenderness, masses, or nipple abnormality  Abdomen:     Soft, non-tender, bowel sounds active all four quadrants,    no masses, no organomegaly  Genitalia:    Normal female without lesion, discharge or tenderness     Extremities:   Extremities normal, atraumatic, no cyanosis or edema  Pulses:   2+ and symmetric all extremities  Skin:   Skin color, texture, turgor normal, no rashes or lesions   Angela [name] was seen today for gynecologic exam.  Diagnoses and all orders for this visit:  Well woman exam with routine gynecological exam -     Cytology - PAP( [facility]) -     Cervicovaginal ancillary only( [facility]) -     CBC -     Comp Met ([facility]) -     Lipid panel -     Vitamin D 1,25 dihydroxy -     Hemoglobin A1c -     TSH  History of abnormal cervical Pap smear  Breast cancer screening  Encounter for counseling regarding contraception  Routine screening for STI (sexually transmitted infection) -  HIV antibody (with reflex) -     RPR -     Hepatitis B surface antigen -     Hepatitis C antibody   Breast cancer screen - has mammogram scheduled.   Angela [name] L. Harraway-Smith, M.D., Cherlynn June

## 2019-03-12 NOTE — Patient Instructions (Signed)
Levonorgestrel intrauterine device (IUD) What is this medicine? LEVONORGESTREL IUD (LEE voe nor jes trel) is a contraceptive (birth control) device. The device is placed inside the uterus by a healthcare professional. It is used to prevent pregnancy. This device can also be used to treat heavy bleeding that occurs during your period. This medicine may be used for other purposes; ask your health care provider or pharmacist if you have questions. COMMON BRAND NAME(S): Kyleena, LILETTA, Mirena, Skyla What should I tell my health care provider before I take this medicine? They need to know if you have any of these conditions:  abnormal Pap smear  cancer of the breast, uterus, or cervix  diabetes  endometritis  genital or pelvic infection now or in the past  have more than one sexual partner or your partner has more than one partner  heart disease  history of an ectopic or tubal pregnancy  immune system problems  IUD in place  liver disease or tumor  problems with blood clots or take blood-thinners  seizures  use intravenous drugs  uterus of unusual shape  vaginal bleeding that has not been explained  an unusual or allergic reaction to levonorgestrel, other hormones, silicone, or polyethylene, medicines, foods, dyes, or preservatives  pregnant or trying to get pregnant  breast-feeding How should I use this medicine? This device is placed inside the uterus by a health care professional. Talk to your pediatrician regarding the use of this medicine in children. Special care may be needed. Overdosage: If you think you have taken too much of this medicine contact a poison control center or emergency room at once. NOTE: This medicine is only for you. Do not share this medicine with others. What if I miss a dose? This does not apply. Depending on the brand of device you have inserted, the device will need to be replaced every 3 to 6 years if you wish to continue using this type  of birth control. What may interact with this medicine? Do not take this medicine with any of the following medications:  amprenavir  bosentan  fosamprenavir This medicine may also interact with the following medications:  aprepitant  armodafinil  barbiturate medicines for inducing sleep or treating seizures  bexarotene  boceprevir  griseofulvin  medicines to treat seizures like carbamazepine, ethotoin, felbamate, oxcarbazepine, phenytoin, topiramate  modafinil  pioglitazone  rifabutin  rifampin  rifapentine  some medicines to treat HIV infection like atazanavir, efavirenz, indinavir, lopinavir, nelfinavir, tipranavir, ritonavir  St. John's wort  warfarin This list may not describe all possible interactions. Give your health care provider a list of all the medicines, herbs, non-prescription drugs, or dietary supplements you use. Also tell them if you smoke, drink alcohol, or use illegal drugs. Some items may interact with your medicine. What should I watch for while using this medicine? Visit your doctor or health care professional for regular check ups. See your doctor if you or your partner has sexual contact with others, becomes HIV positive, or gets a sexual transmitted disease. This product does not protect you against HIV infection (AIDS) or other sexually transmitted diseases. You can check the placement of the IUD yourself by reaching up to the top of your vagina with clean fingers to feel the threads. Do not pull on the threads. It is a good habit to check placement after each menstrual period. Call your doctor right away if you feel more of the IUD than just the threads or if you cannot feel the threads at   all. The IUD may come out by itself. You may become pregnant if the device comes out. If you notice that the IUD has come out use a backup birth control method like condoms and call your health care provider. Using tampons will not change the position of the  IUD and are okay to use during your period. This IUD can be safely scanned with magnetic resonance imaging (MRI) only under specific conditions. Before you have an MRI, tell your healthcare provider that you have an IUD in place, and which type of IUD you have in place. What side effects may I notice from receiving this medicine? Side effects that you should report to your doctor or health care professional as soon as possible:  allergic reactions like skin rash, itching or hives, swelling of the face, lips, or tongue  fever, flu-like symptoms  genital sores  high blood pressure  no menstrual period for 6 weeks during use  pain, swelling, warmth in the leg  pelvic pain or tenderness  severe or sudden headache  signs of pregnancy  stomach cramping  sudden shortness of breath  trouble with balance, talking, or walking  unusual vaginal bleeding, discharge  yellowing of the eyes or skin Side effects that usually do not require medical attention (report to your doctor or health care professional if they continue or are bothersome):  acne  breast pain  change in sex drive or performance  changes in weight  cramping, dizziness, or faintness while the device is being inserted  headache  irregular menstrual bleeding within first 3 to 6 months of use  nausea This list may not describe all possible side effects. Call your doctor for medical advice about side effects. You may report side effects to FDA at 1-800-FDA-1088. Where should I keep my medicine? This does not apply. NOTE: This sheet is a summary. It may not cover all possible information. If you have questions about this medicine, talk to your doctor, pharmacist, or health care provider.  2020 Elsevier/Gold Standard (2018-06-20 13:22:01) Contraception Choices Contraception, also called birth control, refers to methods or devices that prevent pregnancy. Hormonal methods Contraceptive implant  A contraceptive  implant is a thin, plastic tube that contains a hormone. It is inserted into the upper part of the arm. It can remain in place for up to 3 years. Progestin-only injections Progestin-only injections are injections of progestin, a synthetic form of the hormone progesterone. They are given every 3 months by a health care provider. Birth control pills  Birth control pills are pills that contain hormones that prevent pregnancy. They must be taken once a day, preferably at the same time each day. Birth control patch  The birth control patch contains hormones that prevent pregnancy. It is placed on the skin and must be changed once a week for three weeks and removed on the fourth week. A prescription is needed to use this method of contraception. Vaginal ring  A vaginal ring contains hormones that prevent pregnancy. It is placed in the vagina for three weeks and removed on the fourth week. After that, the process is repeated with a new ring. A prescription is needed to use this method of contraception. Emergency contraceptive Emergency contraceptives prevent pregnancy after unprotected sex. They come in pill form and can be taken up to 5 days after sex. They work best the sooner they are taken after having sex. Most emergency contraceptives are available without a prescription. This method should not be used as your only form  of birth control. Barrier methods Female condom  A female condom is a thin sheath that is worn over the penis during sex. Condoms keep sperm from going inside a woman's body. They can be used with a spermicide to increase their effectiveness. They should be disposed after a single use. Female condom  A female condom is a soft, loose-fitting sheath that is put into the vagina before sex. The condom keeps sperm from going inside a woman's body. They should be disposed after a single use. Diaphragm  A diaphragm is a soft, dome-shaped barrier. It is inserted into the vagina before sex,  along with a spermicide. The diaphragm blocks sperm from entering the uterus, and the spermicide kills sperm. A diaphragm should be left in the vagina for 6-8 hours after sex and removed within 24 hours. A diaphragm is prescribed and fitted by a health care provider. A diaphragm should be replaced every 1-2 years, after giving birth, after gaining more than 15 lb (6.8 kg), and after pelvic surgery. Cervical cap  A cervical cap is a round, soft latex or plastic cup that fits over the cervix. It is inserted into the vagina before sex, along with spermicide. It blocks sperm from entering the uterus. The cap should be left in place for 6-8 hours after sex and removed within 48 hours. A cervical cap must be prescribed and fitted by a health care provider. It should be replaced every 2 years. Sponge  A sponge is a soft, circular piece of polyurethane foam with spermicide on it. The sponge helps block sperm from entering the uterus, and the spermicide kills sperm. To use it, you make it wet and then insert it into the vagina. It should be inserted before sex, left in for at least 6 hours after sex, and removed and thrown away within 30 hours. Spermicides Spermicides are chemicals that kill or block sperm from entering the cervix and uterus. They can come as a cream, jelly, suppository, foam, or tablet. A spermicide should be inserted into the vagina with an applicator at least 10-15 minutes before sex to allow time for it to work. The process must be repeated every time you have sex. Spermicides do not require a prescription. Intrauterine contraception Intrauterine device (IUD) An IUD is a T-shaped device that is put in a woman's uterus. There are two types:  Hormone IUD.This type contains progestin, a synthetic form of the hormone progesterone. This type can stay in place for 3-5 years.  Copper IUD.This type is wrapped in copper wire. It can stay in place for 10 years.  Permanent methods of contraception  Female tubal ligation In this method, a woman's fallopian tubes are sealed, tied, or blocked during surgery to prevent eggs from traveling to the uterus. Hysteroscopic sterilization In this method, a small, flexible insert is placed into each fallopian tube. The inserts cause scar tissue to form in the fallopian tubes and block them, so sperm cannot reach an egg. The procedure takes about 3 months to be effective. Another form of birth control must be used during those 3 months. Female sterilization This is a procedure to tie off the tubes that carry sperm (vasectomy). After the procedure, the man can still ejaculate fluid (semen). Natural planning methods Natural family planning In this method, a couple does not have sex on days when the woman could become pregnant. Calendar method This means keeping track of the length of each menstrual cycle, identifying the days when pregnancy can happen, and not  having sex on those days. Ovulation method In this method, a couple avoids sex during ovulation. Symptothermal method This method involves not having sex during ovulation. The woman typically checks for ovulation by watching changes in her temperature and in the consistency of cervical mucus. Post-ovulation method In this method, a couple waits to have sex until after ovulation. Summary  Contraception, also called birth control, means methods or devices that prevent pregnancy.  Hormonal methods of contraception include implants, injections, pills, patches, vaginal rings, and emergency contraceptives.  Barrier methods of contraception can include female condoms, female condoms, diaphragms, cervical caps, sponges, and spermicides.  There are two types of IUDs (intrauterine devices). An IUD can be put in a woman's uterus to prevent pregnancy for 3-5 years.  Permanent sterilization can be done through a procedure for males, females, or both.  Natural family planning methods involve not having sex  on days when the woman could become pregnant. This information is not intended to replace advice given to you by your health care provider. Make sure you discuss any questions you have with your health care provider. Document Released: 08/09/2005 Document Revised: 08/11/2017 Document Reviewed: 09/11/2016 Elsevier Patient Education  2020 Elsevier Inc.  

## 2019-03-14 LAB — CYTOLOGY - PAP
Diagnosis: NEGATIVE
HPV: DETECTED — AB

## 2019-03-15 ENCOUNTER — Ambulatory Visit: Payer: BC Managed Care – PPO | Admitting: Family Medicine

## 2019-03-15 ENCOUNTER — Telehealth: Payer: Self-pay

## 2019-03-15 LAB — CERVICOVAGINAL ANCILLARY ONLY
Bacterial vaginitis: POSITIVE — AB
Candida vaginitis: NEGATIVE
Trichomonas: NEGATIVE

## 2019-03-15 MED ORDER — METRONIDAZOLE 500 MG PO TABS: 500.0000 mg | ORAL_TABLET | Freq: Two times a day (BID) | ORAL | 0 refills | Status: DC

## 2019-03-15 NOTE — Telephone Encounter (Signed)
Left message for patient to return call to office for pap results.   "Her PAP was neg but, her hrHPV was positive. She needs a yearly PAP.   Clh-S "

## 2019-03-15 NOTE — Telephone Encounter (Signed)
Patient return call and made aware that pap was normal but it did detect HPV. Patient made aware that she needs repeat pap smear in one year. Patient states understanding.  Patient also asked about BV or yeast- made her aware that there was some BV noted on the vaginal culture and we can treat that with Flagyl. Patient agrees and desires treatment. Sent to pharmacy per protocol. Kathrene Alu RN

## 2019-03-16 LAB — COMPREHENSIVE METABOLIC PANEL
ALT: 8 IU/L (ref 0–32)
AST: 12 IU/L (ref 0–40)
Albumin/Globulin Ratio: 2 (ref 1.2–2.2)
Albumin: 4.9 g/dL — ABNORMAL HIGH (ref 3.8–4.8)
Alkaline Phosphatase: 66 IU/L (ref 39–117)
BUN/Creatinine Ratio: 17 (ref 9–23)
BUN: 12 mg/dL (ref 6–24)
Bilirubin Total: <0.2 mg/dL (ref 0.0–1.2)
CO2: 16 mmol/L — ABNORMAL LOW (ref 20–29)
Calcium: 9.5 mg/dL (ref 8.7–10.2)
Chloride: 105 mmol/L (ref 96–106)
Creatinine, Ser: 0.7 mg/dL (ref 0.57–1.00)
GFR calc Af Amer: 124 mL/min/{1.73_m2} (ref >59)
GFR calc non Af Amer: 107 mL/min/{1.73_m2} (ref >59)
Globulin, Total: 2.4 g/dL (ref 1.5–4.5)
Glucose: 83 mg/dL (ref 65–99)
Potassium: 4.3 mmol/L (ref 3.5–5.2)
Sodium: 142 mmol/L (ref 134–144)
Total Protein: 7.3 g/dL (ref 6.0–8.5)

## 2019-03-16 LAB — LIPID PANEL
Chol/HDL Ratio: 3.1 ratio (ref 0.0–4.4)
Cholesterol, Total: 201 mg/dL — ABNORMAL HIGH (ref 100–199)
HDL: 64 mg/dL (ref >39)
LDL Calculated: 120 mg/dL — ABNORMAL HIGH (ref 0–99)
Triglycerides: 86 mg/dL (ref 0–149)
VLDL Cholesterol Cal: 17 mg/dL (ref 5–40)

## 2019-03-16 LAB — CBC
Hematocrit: 42.7 % (ref 34.0–46.6)
Hemoglobin: 14 g/dL (ref 11.1–15.9)
MCH: 30.7 pg (ref 26.6–33.0)
MCHC: 32.8 g/dL (ref 31.5–35.7)
MCV: 94 fL (ref 79–97)
Platelets: 268 10*3/uL (ref 150–450)
RBC: 4.56 x10E6/uL (ref 3.77–5.28)
RDW: 13 % (ref 11.7–15.4)
WBC: 6.5 10*3/uL (ref 3.4–10.8)

## 2019-03-16 LAB — VITAMIN D 1,25 DIHYDROXY
Vitamin D 1, 25 (OH)2 Total: 40 pg/mL
Vitamin D2 1, 25 (OH)2: <10 pg/mL
Vitamin D3 1, 25 (OH)2: 40 pg/mL

## 2019-03-16 LAB — HIV ANTIBODY (ROUTINE TESTING W REFLEX): HIV Screen 4th Generation wRfx: NONREACTIVE

## 2019-03-16 LAB — HEPATITIS C ANTIBODY: Hep C Virus Ab: <0.1 s/co ratio (ref 0.0–0.9)

## 2019-03-16 LAB — HEMOGLOBIN A1C
Est. average glucose Bld gHb Est-mCnc: 100 mg/dL
Hgb A1c MFr Bld: 5.1 % (ref 4.8–5.6)

## 2019-03-16 LAB — HEPATITIS B SURFACE ANTIGEN: Hepatitis B Surface Ag: NEGATIVE

## 2019-03-16 LAB — TSH: TSH: 1.37 u[IU]/mL (ref 0.450–4.500)

## 2019-03-16 LAB — RPR: RPR Ser Ql: NONREACTIVE

## 2019-03-19 ENCOUNTER — Telehealth: Payer: Self-pay

## 2019-03-19 NOTE — Telephone Encounter (Signed)
Patient called and made aware that her bloodwork on 03/12/2019 was all within normal limits with the exception of her Chloesterol. Made her aware that it was elevated and she should follow up with her primary care doctor about this. Patient states understanding. Kathrene Alu RN

## 2019-04-19 ENCOUNTER — Ambulatory Visit
Admission: RE | Admit: 2019-04-19 | Discharge: 2019-04-19 | Disposition: A | Discharge status: Home / Self Care | Payer: BC Managed Care – PPO | Source: Ambulatory Visit | Attending: Obstetrics & Gynecology | Admitting: Obstetrics & Gynecology

## 2019-04-19 ENCOUNTER — Other Ambulatory Visit: Payer: Self-pay

## 2019-04-19 DIAGNOSIS — Z1231 Encounter for screening mammogram for malignant neoplasm of breast: Secondary | ICD-10-CM

## 2019-06-07 ENCOUNTER — Ambulatory Visit: Payer: BLUE CROSS/BLUE SHIELD | Admitting: Family Medicine

## 2019-06-08 ENCOUNTER — Encounter: Payer: Self-pay | Admitting: Family Medicine

## 2019-06-08 ENCOUNTER — Other Ambulatory Visit: Payer: Self-pay | Admitting: Family Medicine

## 2019-06-08 ENCOUNTER — Ambulatory Visit (INDEPENDENT_AMBULATORY_CARE_PROVIDER_SITE_OTHER): Payer: BC Managed Care – PPO | Admitting: Family Medicine

## 2019-06-08 ENCOUNTER — Other Ambulatory Visit: Payer: Self-pay

## 2019-06-08 VITALS — BP 110/70 | HR 100 | Temp 98.4°F | Resp 18 | Ht 61.0 in | Wt 114.0 lb

## 2019-06-08 DIAGNOSIS — G44219 Episodic tension-type headache, not intractable: Secondary | ICD-10-CM | POA: Diagnosis not present

## 2019-06-08 DIAGNOSIS — F419 Anxiety disorder, unspecified: Secondary | ICD-10-CM

## 2019-06-08 MED ORDER — CYCLOBENZAPRINE HCL 10 MG PO TABS: 10.0000 mg | ORAL_TABLET | Freq: Three times a day (TID) | ORAL | 0 refills | Status: DC | PRN

## 2019-06-08 MED ORDER — TRIAMCINOLONE ACETONIDE 0.1 % EX CREA: 1.0000 "application " | TOPICAL_CREAM | Freq: Two times a day (BID) | CUTANEOUS | 0 refills | Status: DC

## 2019-06-08 NOTE — Progress Notes (Signed)
Subjective:    Patient ID: Angela [name], female    DOB: 1975/12/27, 43 y.o.   MRN: [id number]  HPI Patient reports a 2 to 3-week history of almost daily headaches.  She has started a new job.  She is under more stress.  She is also staring at a computer screen all throughout the day taking online training.  She reports a bandlike pressure pain that wraps around her temples on both sides and radiates into her occiput and occasionally down the right side of her neck.  She also reports occasional muscle spasms in her right trapezius.  She denies any pulsatile headaches.  She denies any photophobia or phonophobia.  She denies any dizziness nausea or vomiting.  She denies any neurologic deficits on exam.  She denies any frontal or maxillary sinus tenderness to palpation.  She does have poor dentition but there is no evidence of a dental abscess or oral infection.  She denies any fevers or chills.  She also has dyshidrotic eczema in the webspaces between her fingers due to washing her hands frequently and using hand sanitizer.  However there is a pink erythematous well-circumscribed patch on her right elbow with white silvery scale possibly due to psoriasis Past Medical History:  Diagnosis Date  . Allergy    latex and codeine  . Anxiety    on lexapro  . Heavy smoker (more than 20 cigarettes per day) 08/06/2011  . Vaginal Pap smear, abnormal    LGSIL with HPV 2019 -colpo done   No past surgical history on file. Current Outpatient Medications on File Prior to Visit  Medication Sig Dispense Refill  . escitalopram (LEXAPRO) 20 MG tablet TAKE 1 TABLET BY MOUTH ONCE DAILY 30 tablet 5  . metroNIDAZOLE (FLAGYL) 500 MG tablet Take 1 tablet (500 mg total) by mouth 2 (two) times daily. 14 tablet 0   No current facility-administered medications on file prior to visit.    Allergies  Allergen Reactions  . Latex   . Augmentin [Amoxicillin-Pot Clavulanate]     GI Upset  . Codeine    Social  History   Socioeconomic History  . Marital status: Single    Spouse name: Not on file  . Number of children: Not on file  . Years of education: Not on file  . Highest education level: Not on file  Occupational History  . Not on file  Social Needs  . Financial resource strain: Not on file  . Food insecurity    Worry: Not on file    Inability: Not on file  . Transportation needs    Medical: Not on file    Non-medical: Not on file  Tobacco Use  . Smoking status: Current Every Day Smoker    Packs/day: 1.00    Years: 15.00    Pack years: 15.00    Types: Cigarettes  . Smokeless tobacco: Never Used  . Tobacco comment: trying to cut back and quit  Substance and Sexual Activity  . Alcohol use: Yes    Comment: rarely  . Drug use: No  . Sexual activity: Yes    Birth control/protection: None  Lifestyle  . Physical activity    Days per week: Not on file    Minutes per session: Not on file  . Stress: Not on file  Relationships  . Social Musician on phone: Not on file    Gets together: Not on file    Attends religious service: Not on file  Active member of club or organization: Not on file    Attends meetings of clubs or organizations: Not on file    Relationship status: Not on file  . Intimate partner violence    Fear of current or ex partner: Not on file    Emotionally abused: Not on file    Physically abused: Not on file    Forced sexual activity: Not on file  Other Topics Concern  . Not on file  Social History Narrative  . Not on file      Review of Systems  All other systems reviewed and are negative.      Objective:   Physical Exam Vitals signs reviewed.  Constitutional:      General: She is not in acute distress.    Appearance: Normal appearance. She is not ill-appearing or toxic-appearing.  HENT:     Right Ear: Tympanic membrane and ear canal normal.     Left Ear: Tympanic membrane and ear canal normal.     Nose: Nose normal.  Eyes:      Extraocular Movements: Extraocular movements intact.     Conjunctiva/sclera: Conjunctivae normal.     Pupils: Pupils are equal, round, and reactive to light.  Cardiovascular:     Rate and Rhythm: Normal rate and regular rhythm.     Heart sounds: Normal heart sounds.  Pulmonary:     Effort: Pulmonary effort is normal.     Breath sounds: Normal breath sounds.  Neurological:     General: No focal deficit present.     Mental Status: She is alert and oriented to person, place, and time.     Cranial Nerves: No cranial nerve deficit.     Sensory: No sensory deficit.     Motor: No weakness.     Coordination: Coordination normal.     Gait: Gait normal.           Assessment & Plan:  Episodic tension-type headache, not intractable  I have recommended using ibuprofen 800 mg during the daytime and then using Flexeril 5 to 10 mg p.o. every 8 hours as needed in the afternoons and evenings to relax muscle tension and treat the headaches.  I believe if we do these 2 options we can help break the cycle of the headaches and they should start to subside.  I recommended triamcinolone cream for the dyshidrotic eczema in the webspaces between her fingers on her hands.  She can also apply this to her elbow however if the rash on her elbow is not improving, I would recommend a dermatology consultation for psoriasis as that has the characteristic appearance of psoriasis.

## 2019-08-26 DIAGNOSIS — Z20828 Contact with and (suspected) exposure to other viral communicable diseases: Secondary | ICD-10-CM | POA: Diagnosis not present

## 2019-08-27 ENCOUNTER — Other Ambulatory Visit: Payer: Self-pay

## 2019-08-27 ENCOUNTER — Ambulatory Visit (INDEPENDENT_AMBULATORY_CARE_PROVIDER_SITE_OTHER): Payer: BC Managed Care – PPO | Admitting: Family Medicine

## 2019-08-27 DIAGNOSIS — J069 Acute upper respiratory infection, unspecified: Secondary | ICD-10-CM | POA: Diagnosis not present

## 2019-08-27 MED ORDER — AZITHROMYCIN 250 MG PO TABS: ORAL_TABLET | ORAL | 0 refills | Status: DC

## 2019-08-27 NOTE — Progress Notes (Signed)
Subjective:    Patient ID: Angela [name], female    DOB: Apr 08, 1976, 44 y.o.   MRN: [id number]  HPI Patient is being seen today as a telephone visit.  Patient consents to be seen over the telephone.  Phone call began at 245.  Phone call concluded at 259.  Patient states that her symptoms began last Wednesday.  This will be 6 days ago.  Symptoms started with a mild sore throat and postnasal drip.  The symptoms progressed gradually over the weekend to include sneezing, increasing head congestion, postnasal drip causing cough, and sinus pressure.  She now has increasing sinus pressure as well as pressure in both ears.  She is not running a fever.  Her temperature has been between 97.5 and 98.5.  However when she blows her nose she is getting a lot of thick green mucus and snot.  She is also coughing up green mucus that is drainage from her sinuses because she can feel it going down her throat.  She was tested for Covid yesterday and is awaiting the results however she feels worse than she did 1 symptoms started last Wednesday.  She denies any chest pain shortness of breath or dyspnea on exertion.  She denies any loss of her sense of smell or taste. Past Medical History:  Diagnosis Date  . Allergy    latex and codeine  . Anxiety    on lexapro  . Heavy smoker (more than 20 cigarettes per day) 08/06/2011  . Vaginal Pap smear, abnormal    LGSIL with HPV 2019 -colpo done   No past surgical history on file. Current Outpatient Medications on File Prior to Visit  Medication Sig Dispense Refill  . cyclobenzaprine (FLEXERIL) 10 MG tablet Take 1 tablet (10 mg total) by mouth 3 (three) times daily as needed (tension headaches). 30 tablet 0  . escitalopram (LEXAPRO) 20 MG tablet Take 1 tablet by mouth once daily 90 tablet 0  . triamcinolone cream (KENALOG) 0.1 % Apply 1 application topically 2 (two) times daily. 30 g 0   No current facility-administered medications on file prior to visit.    Allergies  Allergen Reactions  . Latex   . Augmentin [Amoxicillin-Pot Clavulanate]     GI Upset  . Codeine    Social History   Socioeconomic History  . Marital status: Single    Spouse name: Not on file  . Number of children: Not on file  . Years of education: Not on file  . Highest education level: Not on file  Occupational History  . Not on file  Tobacco Use  . Smoking status: Current Every Day Smoker    Packs/day: 1.00    Years: 15.00    Pack years: 15.00    Types: Cigarettes  . Smokeless tobacco: Never Used  . Tobacco comment: trying to cut back and quit  Substance and Sexual Activity  . Alcohol use: Yes    Comment: rarely  . Drug use: No  . Sexual activity: Yes    Birth control/protection: None  Other Topics Concern  . Not on file  Social History Narrative  . Not on file   Social Determinants of Health   Financial Resource Strain:   . Difficulty of Paying Living Expenses: Not on file  Food Insecurity:   . Worried About Programme researcher, broadcasting/film/video in the Last Year: Not on file  . Ran Out of Food in the Last Year: Not on file  Transportation Needs:   .  Lack of Transportation (Medical): Not on file  . Lack of Transportation (Non-Medical): Not on file  Physical Activity:   . Days of Exercise per Week: Not on file  . Minutes of Exercise per Session: Not on file  Stress:   . Feeling of Stress : Not on file  Social Connections:   . Frequency of Communication with Friends and Family: Not on file  . Frequency of Social Gatherings with Friends and Family: Not on file  . Attends Religious Services: Not on file  . Active Member of Clubs or Organizations: Not on file  . Attends Archivist Meetings: Not on file  . Marital Status: Not on file  Intimate Partner Violence:   . Fear of Current or Ex-Partner: Not on file  . Emotionally Abused: Not on file  . Physically Abused: Not on file  . Sexually Abused: Not on file      Review of Systems  All other  systems reviewed and are negative.      Objective:   Physical Exam        Assessment & Plan:  Viral URI  Patient symptoms are consistent with a viral upper respiratory infection.  I explained to the patient that viral upper respiratory infections improve 90% of the time over 7 to 10 days.  Therefore I recommended tincture of time.  She can use Sudafed for head congestion and rhinorrhea.  She can use ibuprofen for head congestion and body aches and sinus pain.  She can use Delsym as needed for cough.  I did give the patient a Z-Pak with strict instructions not to fill for 48 to 72 hours unless symptoms drastically worsen or she develops pain in her sinuses or pain in her teeth.  I have also recommended that she quarantine at home until her Covid test returns positive.  I explained that COVID-19 is a virus that can give similar symptoms to a common cold which is what I believe she has and therefore she should not work until we have the results back.  Therefore she will be at home quarantining for the next 48 hours until test return.  If she develops chest pain or shortness of breath she is to go directly to the hospital.  If she develops worsening pain in her sinuses over the next 2 days or high fever or pain in her teeth she is directed to take the Z-Pak.

## 2019-08-28 ENCOUNTER — Telehealth: Payer: Self-pay

## 2019-08-28 ENCOUNTER — Other Ambulatory Visit: Payer: Self-pay | Admitting: Family Medicine

## 2019-08-28 MED ORDER — BENZONATATE 200 MG PO CAPS: 200.0000 mg | ORAL_CAPSULE | Freq: Three times a day (TID) | ORAL | 0 refills | Status: DC | PRN

## 2019-08-28 NOTE — Telephone Encounter (Signed)
Pt called to report that her covid test was negative and would like for you to sent her an rx for tesselon pearls to help with her cough. Pt also states that she is going to start the z-pak.

## 2019-09-20 ENCOUNTER — Encounter: Payer: Self-pay | Admitting: Family Medicine

## 2019-10-17 ENCOUNTER — Ambulatory Visit: Payer: BC Managed Care – PPO | Admitting: Nurse Practitioner

## 2019-10-18 ENCOUNTER — Other Ambulatory Visit: Payer: Self-pay

## 2019-10-18 ENCOUNTER — Ambulatory Visit (INDEPENDENT_AMBULATORY_CARE_PROVIDER_SITE_OTHER): Payer: BC Managed Care – PPO | Admitting: Nurse Practitioner

## 2019-10-18 VITALS — BP 106/68 | HR 83 | Temp 98.8°F | Resp 18 | Ht 61.0 in | Wt 122.0 lb

## 2019-10-18 DIAGNOSIS — J0100 Acute maxillary sinusitis, unspecified: Secondary | ICD-10-CM | POA: Diagnosis not present

## 2019-10-18 DIAGNOSIS — Z03818 Encounter for observation for suspected exposure to other biological agents ruled out: Secondary | ICD-10-CM | POA: Diagnosis not present

## 2019-10-18 DIAGNOSIS — F419 Anxiety disorder, unspecified: Secondary | ICD-10-CM

## 2019-10-18 DIAGNOSIS — Z20828 Contact with and (suspected) exposure to other viral communicable diseases: Secondary | ICD-10-CM | POA: Diagnosis not present

## 2019-10-18 DIAGNOSIS — N760 Acute vaginitis: Secondary | ICD-10-CM

## 2019-10-18 DIAGNOSIS — J00 Acute nasopharyngitis [common cold]: Secondary | ICD-10-CM

## 2019-10-18 DIAGNOSIS — B86 Scabies: Secondary | ICD-10-CM

## 2019-10-18 DIAGNOSIS — Z1152 Encounter for screening for COVID-19: Secondary | ICD-10-CM

## 2019-10-18 DIAGNOSIS — B9689 Other specified bacterial agents as the cause of diseases classified elsewhere: Secondary | ICD-10-CM

## 2019-10-18 MED ORDER — METRONIDAZOLE 500 MG PO TABS: 500.0000 mg | ORAL_TABLET | Freq: Three times a day (TID) | ORAL | 0 refills | Status: DC

## 2019-10-18 MED ORDER — PERMETHRIN 5 % EX CREA: 1.0000 "application " | TOPICAL_CREAM | Freq: Once | CUTANEOUS | 0 refills | Status: DC

## 2019-10-18 MED ORDER — AZITHROMYCIN 250 MG PO TABS: ORAL_TABLET | ORAL | 0 refills | Status: DC

## 2019-10-18 MED ORDER — FLUTICASONE PROPIONATE 50 MCG/ACT NA SUSP: 2.0000 | Freq: Every day | NASAL | 6 refills | Status: DC

## 2019-10-18 MED ORDER — ESCITALOPRAM OXALATE 20 MG PO TABS: 20.0000 mg | ORAL_TABLET | Freq: Every day | ORAL | 0 refills | Status: DC

## 2019-10-18 MED ORDER — PERMETHRIN 5 % EX CREA: 1.0000 "application " | TOPICAL_CREAM | Freq: Once | CUTANEOUS | 0 refills | Status: AC

## 2019-10-18 NOTE — Progress Notes (Signed)
Acute Office Visit  Subjective:    Patient ID: Angela [name], female    DOB: [birth year], 44 y.o.   MRN: [id number]  Chief Complaint  Patient presents with  . Headache    and ear pressure, x1 week, facial pressure, sudafed was teken    HPI Patient is a 44 y.o female presenting to the clinic for headache, nasal congestion, sinus pressure, ear pressure bilaterally, generally not feeling well. She reported negative covid test however this test was completed in Jan and that illness resolved. She also reports vaginal discharge that is tan to greenish with odor that is same as her prior BV infections. She also reports small bumps on her torso and back.   headache, nasal congestion, sinus pressure, ear pressure bilaterally: generally not feeling well: sxs started last Wednesday and have slightly worsened. The headache is pressure that coincides with sinus pressure. She has taken Ibuprofen 200mg  once or twice during the day stating she is not a medication taker. She works at the reportedly wearing a mask, she did have a new sexual encounter the other week and spent time at his home in his bed, she does not know of sick encounters or known exposure to COVID or Scabies.  vaginal discharge: sxs started a few days ago of tan to greenish foul odor discharge that itches, non draining.no txs tried, she reports consistent/similar with past BV episode.  small bumps on her torso and back: sxs occurred just after sleeping in a new female encounters bed. She is not sure of other contacts with similar sxs. No discharge production or drainage and no foul odor. No treatments tried.  Past Medical History:  Diagnosis Date  . Allergy    latex and codeine  . Anxiety    on lexapro  . Heavy smoker (more than 20 cigarettes per day) 08/06/2011  . Vaginal Pap smear, abnormal    LGSIL with HPV 2019 -colpo done    No past surgical history on file.  Family History  Problem Relation Age of Onset  .  Cancer Maternal Grandmother        throat  . Cancer Maternal Grandfather        prostate  . Breast cancer Mother 50    Social History   Socioeconomic History  . Marital status: Single    Spouse name: Not on file  . Number of children: Not on file  . Years of education: Not on file  . Highest education level: Not on file  Occupational History  . Not on file  Tobacco Use  . Smoking status: Current Every Day Smoker    Packs/day: 1.00    Years: 15.00    Pack years: 15.00    Types: Cigarettes  . Smokeless tobacco: Never Used  . Tobacco comment: trying to cut back and quit  Substance and Sexual Activity  . Alcohol use: Yes    Comment: rarely  . Drug use: No  . Sexual activity: Yes    Birth control/protection: None  Other Topics Concern  . Not on file  Social History Narrative  . Not on file   Social Determinants of Health   Financial Resource Strain:   . Difficulty of Paying Living Expenses: Not on file  Food Insecurity:   . Worried About 78 in the Last Year: Not on file  . Ran Out of Food in the Last Year: Not on file  Transportation Needs:   . Lack of Transportation (Medical): Not  on file  . Lack of Transportation (Non-Medical): Not on file  Physical Activity:   . Days of Exercise per Week: Not on file  . Minutes of Exercise per Session: Not on file  Stress:   . Feeling of Stress : Not on file  Social Connections:   . Frequency of Communication with Friends and Family: Not on file  . Frequency of Social Gatherings with Friends and Family: Not on file  . Attends Religious Services: Not on file  . Active Member of Clubs or Organizations: Not on file  . Attends Banker Meetings: Not on file  . Marital Status: Not on file  Intimate Partner Violence:   . Fear of Current or Ex-Partner: Not on file  . Emotionally Abused: Not on file  . Physically Abused: Not on file  . Sexually Abused: Not on file    Outpatient Medications Prior to  Visit  Medication Sig Dispense Refill  . escitalopram (LEXAPRO) 20 MG tablet Take 1 tablet by mouth once daily 90 tablet 0  . triamcinolone cream (KENALOG) 0.1 % Apply 1 application topically 2 (two) times daily. 30 g 0  . azithromycin (ZITHROMAX) 250 MG tablet 2 tabs poqday1, 1 tab poqday 2-5 6 tablet 0  . benzonatate (TESSALON) 100 MG capsule TAKE 2 CAPSULES (200 MG TOTAL) BY MOUTH 3 (THREE) TIMES DAILY AS NEEDED FOR COUGH. 30 capsule 0  . benzonatate (TESSALON) 200 MG capsule Take 1 capsule (200 mg total) by mouth 3 (three) times daily as needed for cough. 20 capsule 0  . cyclobenzaprine (FLEXERIL) 10 MG tablet Take 1 tablet (10 mg total) by mouth 3 (three) times daily as needed (tension headaches). 30 tablet 0   No facility-administered medications prior to visit.    Allergies  Allergen Reactions  . Latex   . Augmentin [Amoxicillin-Pot Clavulanate]     GI Upset  . Codeine     Review of Systems  All other systems reviewed and are negative.      Objective:    Physical Exam Vitals and nursing note reviewed.  Constitutional:      Appearance: Normal appearance. She is well-developed.  HENT:     Head: Normocephalic.     Right Ear: Hearing, tympanic membrane, ear canal and external ear normal.     Left Ear: Hearing, tympanic membrane, ear canal and external ear normal.     Nose: Mucosal edema, congestion and rhinorrhea present. No nasal deformity, septal deviation, signs of injury, laceration or nasal tenderness.     Right Sinus: Maxillary sinus tenderness present.     Left Sinus: Maxillary sinus tenderness present.     Mouth/Throat:     Mouth: Mucous membranes are moist.     Pharynx: Uvula midline. Pharyngeal swelling and posterior oropharyngeal erythema present. No oropharyngeal exudate or uvula swelling.     Tonsils: No tonsillar exudate or tonsillar abscesses.  Eyes:     General: Lids are normal. Lids are everted, no foreign bodies appreciated.     Extraocular Movements:  Extraocular movements intact.     Conjunctiva/sclera: Conjunctivae normal.     Pupils: Pupils are equal, round, and reactive to light.  Neck:     Thyroid: No thyroid mass, thyromegaly or thyroid tenderness.  Cardiovascular:     Rate and Rhythm: Normal rate and regular rhythm.     Pulses: Normal pulses.     Heart sounds: Normal heart sounds, S1 normal and S2 normal.  Pulmonary:     Effort: Pulmonary effort  is normal.     Breath sounds: Normal breath sounds.  Abdominal:     General: Bowel sounds are normal.     Palpations: Abdomen is soft.     Tenderness: There is no abdominal tenderness. There is no guarding.  Musculoskeletal:        General: Normal range of motion.     Cervical back: Full passive range of motion without pain, normal range of motion and neck supple.  Lymphadenopathy:     Head:     Right side of head: Tonsillar adenopathy present. No submental, submandibular, preauricular, posterior auricular or occipital adenopathy.     Left side of head: Tonsillar adenopathy present. No submental, submandibular, preauricular, posterior auricular or occipital adenopathy.     Cervical: Cervical adenopathy present.  Skin:    General: Skin is warm and dry.     Capillary Refill: Capillary refill takes less than 2 seconds.          Comments: Bumps burrowed consistent with scabies  Neurological:     Mental Status: She is alert and oriented to person, place, and time.  Psychiatric:        Attention and Perception: Attention normal.        Mood and Affect: Mood and affect normal.        Speech: Speech normal.        Behavior: Behavior is cooperative.        Cognition and Memory: Cognition normal.        Judgment: Judgment normal.     BP 106/68 (BP Location: Right Arm, Patient Position: Sitting, Cuff Size: Normal)   Pulse 83   Temp 98.8 F (37.1 C) (Oral)   Resp 18   Ht 5\' 1"  (1.549 m)   Wt 122 lb (55.3 kg)   LMP 10/12/2019   SpO2 97%   BMI 23.05 kg/m  Wt Readings from Last  3 Encounters:  10/18/19 122 lb (55.3 kg)  06/08/19 114 lb (51.7 kg)  03/12/19 111 lb 0.6 oz (50.4 kg)    Health Maintenance Due  Topic Date Due  . TETANUS/TDAP  02/01/1996  . INFLUENZA VACCINE  03/24/2019    There are no preventive care reminders to display for this patient.   Lab Results  Component Value Date   TSH 1.370 03/12/2019   Lab Results  Component Value Date   WBC 6.5 03/12/2019   HGB 14.0 03/12/2019   HCT 42.7 03/12/2019   MCV 94 03/12/2019   PLT 268 03/12/2019   Lab Results  Component Value Date   NA 142 03/12/2019   K 4.3 03/12/2019   CO2 16 (L) 03/12/2019   GLUCOSE 83 03/12/2019   BUN 12 03/12/2019   CREATININE 0.70 03/12/2019   BILITOT <0.2 03/12/2019   ALKPHOS 66 03/12/2019   AST 12 03/12/2019   ALT 8 03/12/2019   PROT 7.3 03/12/2019   ALBUMIN 4.9 (H) 03/12/2019   CALCIUM 9.5 03/12/2019   Lab Results  Component Value Date   CHOL 201 (H) 03/12/2019   Lab Results  Component Value Date   HDL 64 03/12/2019   Lab Results  Component Value Date   LDLCALC 120 (H) 03/12/2019   Lab Results  Component Value Date   TRIG 86 03/12/2019   Lab Results  Component Value Date   CHOLHDL 3.1 03/12/2019   Lab Results  Component Value Date   HGBA1C 5.1 03/12/2019       Assessment & Plan:   Get a COVID test today  self isolate until you get negative result and or sxs free.  Rhino-Sinusitis: start and complete antibiotic and use Flonase for 5 days.  Pain: May take over the counter ibuprofen or tylenol for pain relief Seek medical attention for worsening or non resolving sxs.  Scabies: treat as directed in office BV: start and complete medication, return for non resolved sxs, no diagnostic lab today r/t covid testing will return for testing if not resolved and once test for covid is negative Print out education provided Work note not to return to work until negative COVID test or sxs resolved.   Problem List Items Addressed This Visit    None      Visit Diagnoses    Acute non-recurrent maxillary sinusitis    -  Primary   Relevant Medications   metroNIDAZOLE (FLAGYL) 500 MG tablet   azithromycin (ZITHROMAX) 250 MG tablet   fluticasone (FLONASE) 50 MCG/ACT nasal spray   Scabies       Relevant Medications   permethrin (ELIMITE) 5 % cream   Acute rhinitis       Relevant Medications   fluticasone (FLONASE) 50 MCG/ACT nasal spray   BV (bacterial vaginosis)       Relevant Medications   metroNIDAZOLE (FLAGYL) 500 MG tablet   azithromycin (ZITHROMAX) 250 MG tablet   Encounter for screening for COVID-19           Meds ordered this encounter  Medications  . metroNIDAZOLE (FLAGYL) 500 MG tablet    Sig: Take 1 tablet (500 mg total) by mouth 3 (three) times daily.    Dispense:  21 tablet    Refill:  0  . permethrin (ELIMITE) 5 % cream    Sig: Apply 1 application topically once for 1 dose.    Dispense:  1 g    Refill:  0  . azithromycin (ZITHROMAX) 250 MG tablet    Sig: Day 1 take 2 tabs Day 2-5 take 1 tablet    Dispense:  6 tablet    Refill:  0  . fluticasone (FLONASE) 50 MCG/ACT nasal spray    Sig: Place 2 sprays into both nostrils daily for 5 days.    Dispense:  9.9 g    Refill:  6   Follow up: as needed for non resolved or worsening sxs.  Annie Main, FNP

## 2019-10-29 ENCOUNTER — Telehealth: Payer: Self-pay | Admitting: *Deleted

## 2019-10-29 DIAGNOSIS — B86 Scabies: Secondary | ICD-10-CM

## 2019-10-29 NOTE — Telephone Encounter (Signed)
Received VM from patient.   States that she spoke with fellow MD on call over the weekend and was prescribed another round of Permethrin and Prednisone taper. States that she has used Permethrin, but has questions about prednisone taper.   Call placed to patient. LMTRC.

## 2019-10-30 NOTE — Telephone Encounter (Signed)
Patient returned call.   Reports that she is concerned about possible SE of prednisone. Reports that rash is some improved after second dose of Permethrin.   Advised that at this time, prednisone is indicated. Advised that steroids are used for rashes to stop inflammatory process.   States that she will use prednisone, but would like to use lower dose. Advised that she can take 20mg  QD.

## 2019-10-30 NOTE — Telephone Encounter (Signed)
Call placed to patient. LMTRC.  

## 2019-11-01 NOTE — Addendum Note (Signed)
Addended by: Phillips Odor on: 11/01/2019 12:39 PM   Modules accepted: Orders

## 2019-11-01 NOTE — Telephone Encounter (Signed)
Received return call from patient.   States that irritation is no better. Would like to be seen by dermatology for scrape test.   Referral orders placed.

## 2019-11-06 DIAGNOSIS — D485 Neoplasm of uncertain behavior of skin: Secondary | ICD-10-CM | POA: Diagnosis not present

## 2019-11-06 DIAGNOSIS — L400 Psoriasis vulgaris: Secondary | ICD-10-CM | POA: Diagnosis not present

## 2019-11-06 DIAGNOSIS — L111 Transient acantholytic dermatosis [Grover]: Secondary | ICD-10-CM | POA: Diagnosis not present

## 2019-11-20 DIAGNOSIS — L400 Psoriasis vulgaris: Secondary | ICD-10-CM | POA: Diagnosis not present

## 2019-11-20 DIAGNOSIS — L111 Transient acantholytic dermatosis [Grover]: Secondary | ICD-10-CM | POA: Diagnosis not present

## 2019-12-12 ENCOUNTER — Other Ambulatory Visit: Payer: Self-pay | Admitting: Family Medicine

## 2019-12-12 DIAGNOSIS — F419 Anxiety disorder, unspecified: Secondary | ICD-10-CM

## 2020-04-07 ENCOUNTER — Other Ambulatory Visit: Payer: Self-pay

## 2020-04-07 ENCOUNTER — Ambulatory Visit (INDEPENDENT_AMBULATORY_CARE_PROVIDER_SITE_OTHER): Payer: BC Managed Care – PPO | Admitting: Nurse Practitioner

## 2020-04-07 ENCOUNTER — Encounter: Payer: Self-pay | Admitting: Family Medicine

## 2020-04-07 VITALS — BP 104/66 | HR 88 | Temp 98.1°F | Resp 18 | Wt 120.2 lb

## 2020-04-07 DIAGNOSIS — R399 Unspecified symptoms and signs involving the genitourinary system: Secondary | ICD-10-CM | POA: Diagnosis not present

## 2020-04-07 DIAGNOSIS — N3000 Acute cystitis without hematuria: Secondary | ICD-10-CM | POA: Diagnosis not present

## 2020-04-07 LAB — URINALYSIS, ROUTINE W REFLEX MICROSCOPIC
Bilirubin Urine: NEGATIVE
Glucose, UA: NEGATIVE
Hyaline Cast: NONE SEEN /LPF
Ketones, ur: NEGATIVE
Leukocytes,Ua: NEGATIVE
Nitrite: POSITIVE — AB
Specific Gravity, Urine: 1.02 (ref 1.001–1.03)
pH: 5 (ref 5.0–8.0)

## 2020-04-07 LAB — MICROSCOPIC MESSAGE

## 2020-04-07 MED ORDER — NITROFURANTOIN MONOHYD MACRO 100 MG PO CAPS: 100.0000 mg | ORAL_CAPSULE | Freq: Two times a day (BID) | ORAL | 0 refills | Status: DC

## 2020-04-07 NOTE — Progress Notes (Signed)
Established Patient Office Visit  Subjective:  Patient ID: Angela [name], female    DOB: 05/16/1976  Age: 44 y.o. MRN: [id number]  CC:  Chief Complaint  Patient presents with   Urinary Tract Infection    polyuria, burning, started on 08/12, azo was taken    HPI Angela [name] is a 44 year old female presenting for UTI sxs of urinary ugency, pressure, burning that started on Thur night, Friday she started azo and her sxs did improve a bit however not gone, she denied having a fever, chills, or cva tenderness. No recent antibiotic use in past 90 days.    Past Medical History:  Diagnosis Date   Allergy    latex and codeine   Anxiety    on lexapro   Heavy smoker (more than 20 cigarettes per day) 08/06/2011   Vaginal Pap smear, abnormal    LGSIL with HPV 2019 -colpo done    No past surgical history on file.  Family History  Problem Relation Age of Onset   Cancer Maternal Grandmother        throat   Cancer Maternal Grandfather        prostate   Breast cancer Mother 67    Social History   Socioeconomic History   Marital status: Single    Spouse name: Not on file   Number of children: Not on file   Years of education: Not on file   Highest education level: Not on file  Occupational History   Not on file  Tobacco Use   Smoking status: Current Every Day Smoker    Packs/day: 1.00    Years: 15.00    Pack years: 15.00    Types: Cigarettes   Smokeless tobacco: Never Used   Tobacco comment: trying to cut back and quit  Substance and Sexual Activity   Alcohol use: Yes    Comment: rarely   Drug use: No   Sexual activity: Yes    Birth control/protection: None  Other Topics Concern   Not on file  Social History Narrative   Not on file   Social Determinants of Health   Financial Resource Strain:    Difficulty of Paying Living Expenses:   Food Insecurity:    Worried About Programme researcher, broadcasting/film/video in the Last Year:    Occupational psychologist in the Last Year:   Transportation Needs:    Freight forwarder (Medical):    Lack of Transportation (Non-Medical):   Physical Activity:    Days of Exercise per Week:    Minutes of Exercise per Session:   Stress:    Feeling of Stress :   Social Connections:    Frequency of Communication with Friends and Family:    Frequency of Social Gatherings with Friends and Family:    Attends Religious Services:    Active Member of Clubs or Organizations:    Attends Engineer, structural:    Marital Status:   Intimate Partner Violence:    Fear of Current or Ex-Partner:    Emotionally Abused:    Physically Abused:    Sexually Abused:     Outpatient Medications Prior to Visit  Medication Sig Dispense Refill   escitalopram (LEXAPRO) 20 MG tablet Take 1 tablet by mouth once daily 90 tablet 0   triamcinolone cream (KENALOG) 0.1 % Apply 1 application topically 2 (two) times daily. 30 g 0   fluticasone (FLONASE) 50 MCG/ACT nasal spray Place 2 sprays into both  nostrils daily for 5 days. 9.9 g 6   azithromycin (ZITHROMAX) 250 MG tablet Day 1 take 2 tabs Day 2-5 take 1 tablet 6 tablet 0   metroNIDAZOLE (FLAGYL) 500 MG tablet Take 1 tablet (500 mg total) by mouth 3 (three) times daily. 21 tablet 0   No facility-administered medications prior to visit.    Allergies  Allergen Reactions   Latex    Augmentin [Amoxicillin-Pot Clavulanate]     GI Upset   Codeine     ROS Review of Systems  All other systems reviewed and are negative.     Objective:    Physical Exam Vitals and nursing note reviewed.  Constitutional:      General: She is not in acute distress.    Appearance: Normal appearance. She is well-developed and well-groomed. She is not ill-appearing, toxic-appearing or diaphoretic.  HENT:     Head: Normocephalic and atraumatic.     Right Ear: Hearing normal.     Left Ear: Hearing normal.     Nose: Nose normal.  Eyes:     Extraocular  Movements: Extraocular movements intact.     Conjunctiva/sclera: Conjunctivae normal.     Pupils: Pupils are equal, round, and reactive to light.  Cardiovascular:     Rate and Rhythm: Normal rate.  Pulmonary:     Effort: Pulmonary effort is normal.  Abdominal:     General: Abdomen is flat.     Palpations: Abdomen is soft.     Tenderness: There is no right CVA tenderness or left CVA tenderness.  Musculoskeletal:        General: No swelling. Normal range of motion.     Cervical back: Normal range of motion and neck supple. No rigidity.  Skin:    General: Skin is warm.     Coloration: Skin is not jaundiced or pale.     Findings: No rash.  Neurological:     General: No focal deficit present.     Mental Status: She is alert and oriented to person, place, and time.  Psychiatric:        Mood and Affect: Mood normal.        Behavior: Behavior normal. Behavior is cooperative.        Thought Content: Thought content normal.        Judgment: Judgment normal.     BP 104/66 (BP Location: Right Arm, Patient Position: Sitting, Cuff Size: Normal)    Pulse 88    Temp 98.1 F (36.7 C) (Temporal)    Resp 18    Wt 120 lb 3.2 oz (54.5 kg)    SpO2 95%    BMI 22.71 kg/m  Wt Readings from Last 3 Encounters:  04/07/20 120 lb 3.2 oz (54.5 kg)  10/18/19 122 lb (55.3 kg)  06/08/19 114 lb (51.7 kg)     Health Maintenance Due  Topic Date Due   COVID-19 Vaccine (1) Never done   TETANUS/TDAP  Never done   INFLUENZA VACCINE  03/23/2020    There are no preventive care reminders to display for this patient.  Lab Results  Component Value Date   TSH 1.370 03/12/2019   Lab Results  Component Value Date   WBC 6.5 03/12/2019   HGB 14.0 03/12/2019   HCT 42.7 03/12/2019   MCV 94 03/12/2019   PLT 268 03/12/2019   Lab Results  Component Value Date   NA 142 03/12/2019   K 4.3 03/12/2019   CO2 16 (L) 03/12/2019   GLUCOSE 83  03/12/2019   BUN 12 03/12/2019   CREATININE 0.70 03/12/2019    BILITOT <0.2 03/12/2019   ALKPHOS 66 03/12/2019   AST 12 03/12/2019   ALT 8 03/12/2019   PROT 7.3 03/12/2019   ALBUMIN 4.9 (H) 03/12/2019   CALCIUM 9.5 03/12/2019   Lab Results  Component Value Date   CHOL 201 (H) 03/12/2019   Lab Results  Component Value Date   HDL 64 03/12/2019   Lab Results  Component Value Date   LDLCALC 120 (H) 03/12/2019   Lab Results  Component Value Date   TRIG 86 03/12/2019   Lab Results  Component Value Date   CHOLHDL 3.1 03/12/2019   Lab Results  Component Value Date   HGBA1C 5.1 03/12/2019      Assessment & Plan:   Problem List Items Addressed This Visit    None    Visit Diagnoses    UTI symptoms    -  Primary   Relevant Orders   Urinalysis, Routine w reflex microscopic (Completed)   Acute cystitis without hematuria       Relevant Medications   nitrofurantoin, macrocrystal-monohydrate, (MACROBID) 100 MG capsule   Other Relevant Orders   Microscopic Message (Completed)   Urine Culture    drink plenty of water, get plenty of rest, may continue to take Azo, take and complete antibiotic as directed.   Meds ordered this encounter  Medications   nitrofurantoin, macrocrystal-monohydrate, (MACROBID) 100 MG capsule    Sig: Take 1 capsule (100 mg total) by mouth 2 (two) times daily for 7 days.    Dispense:  14 capsule    Refill:  0    Follow-up: Return if symptoms worsen or fail to improve.    Elmore Guise, FNP

## 2020-04-09 LAB — URINE CULTURE
MICRO NUMBER:: 10830910
SPECIMEN QUALITY:: ADEQUATE

## 2020-04-10 ENCOUNTER — Other Ambulatory Visit: Payer: Self-pay | Admitting: Nurse Practitioner

## 2020-04-10 DIAGNOSIS — A498 Other bacterial infections of unspecified site: Secondary | ICD-10-CM

## 2020-04-10 DIAGNOSIS — N3000 Acute cystitis without hematuria: Secondary | ICD-10-CM

## 2020-04-10 MED ORDER — SULFAMETHOXAZOLE-TRIMETHOPRIM 800-160 MG PO TABS: 1.0000 | ORAL_TABLET | Freq: Two times a day (BID) | ORAL | 0 refills | Status: AC

## 2020-04-10 NOTE — Progress Notes (Signed)
Klebsiella pneumoniae   Intermediate response to the Macrobid there is a better choice per the culture so will send new antibiotic over to the pharmacy stop taking Macrobid and start new antibiotic. Sent to [facility].

## 2020-04-14 ENCOUNTER — Telehealth: Payer: Self-pay

## 2020-04-14 ENCOUNTER — Other Ambulatory Visit: Payer: Self-pay | Admitting: Nurse Practitioner

## 2020-04-14 ENCOUNTER — Other Ambulatory Visit: Payer: Self-pay

## 2020-04-14 NOTE — Telephone Encounter (Signed)
Pt called and stated that she would like an Rx for a different antibiotic. She states that is less potent than the bactrim. Please advise.

## 2020-04-14 NOTE — Telephone Encounter (Signed)
Pt notified and states that people are telling her bad things about the anx and she does not feel comfortable taking it. She also wanted to know if she would benefit from taking a probiotic?

## 2020-04-14 NOTE — Telephone Encounter (Signed)
What are her issue with the antibiotic. She really needs to take what was sent as the culture shows will be sufficient to cover the bug that is growing in her urinary tract.

## 2020-04-22 ENCOUNTER — Other Ambulatory Visit: Payer: Self-pay | Admitting: Obstetrics & Gynecology

## 2020-04-22 DIAGNOSIS — Z1231 Encounter for screening mammogram for malignant neoplasm of breast: Secondary | ICD-10-CM

## 2020-04-29 ENCOUNTER — Ambulatory Visit
Admission: RE | Admit: 2020-04-29 | Discharge: 2020-04-29 | Disposition: A | Discharge status: Home / Self Care | Payer: BC Managed Care – PPO | Source: Ambulatory Visit | Attending: Obstetrics & Gynecology | Admitting: Obstetrics & Gynecology

## 2020-04-29 ENCOUNTER — Other Ambulatory Visit: Payer: Self-pay

## 2020-04-29 DIAGNOSIS — Z1231 Encounter for screening mammogram for malignant neoplasm of breast: Secondary | ICD-10-CM | POA: Diagnosis not present

## 2020-05-15 ENCOUNTER — Ambulatory Visit (INDEPENDENT_AMBULATORY_CARE_PROVIDER_SITE_OTHER): Payer: BC Managed Care – PPO | Admitting: Family Medicine

## 2020-05-15 ENCOUNTER — Other Ambulatory Visit: Payer: Self-pay

## 2020-05-15 ENCOUNTER — Telehealth: Payer: Self-pay | Admitting: Family Medicine

## 2020-05-15 ENCOUNTER — Ambulatory Visit: Payer: BC Managed Care – PPO | Admitting: Family Medicine

## 2020-05-15 VITALS — BP 116/80 | HR 71 | Temp 98.1°F | Ht 61.0 in | Wt 120.0 lb

## 2020-05-15 DIAGNOSIS — Z1152 Encounter for screening for COVID-19: Secondary | ICD-10-CM

## 2020-05-15 DIAGNOSIS — N760 Acute vaginitis: Secondary | ICD-10-CM

## 2020-05-15 DIAGNOSIS — N898 Other specified noninflammatory disorders of vagina: Secondary | ICD-10-CM

## 2020-05-15 MED ORDER — FLUCONAZOLE 150 MG PO TABS: 150.0000 mg | ORAL_TABLET | Freq: Once | ORAL | 0 refills | Status: AC

## 2020-05-15 MED ORDER — FLUCONAZOLE 150 MG PO TABS
150.0000 mg | ORAL_TABLET | Freq: Once | ORAL | 0 refills | Status: DC
Start: 2020-05-15 — End: 2020-05-15

## 2020-05-15 MED ORDER — NYSTATIN 100000 UNIT/GM EX CREA: 1.0000 "application " | TOPICAL_CREAM | Freq: Three times a day (TID) | CUTANEOUS | 0 refills | Status: DC

## 2020-05-15 MED ORDER — NYSTATIN 100000 UNIT/GM EX CREA
1.0000 | TOPICAL_CREAM | Freq: Three times a day (TID) | CUTANEOUS | 0 refills | Status: DC
Start: 2020-05-15 — End: 2020-09-02

## 2020-05-15 NOTE — Telephone Encounter (Signed)
pt that was seen today wanted mediction sent [facility] on [address]

## 2020-05-15 NOTE — Progress Notes (Signed)
Subjective:    Patient ID: Angela [name], female    DOB: 1976-01-08, 44 y.o.   MRN: [id number]  HPI Patient has a history of psoriasis.  However recently she has noticed an itchy rash around her vagina.  Just superior to the clitoral hood is an erythematous patch of papules and macules that have coalesced.  There are other similar smaller satellite papules around that.  I suspect Candida.  I do not see a fine silvery scale to suggest psoriasis.  Rash began after she took an antibiotic for a UTI recently.  It is very itchy.  She denies any vaginal discharge.  She is on her period today and therefore wet prep is obscured due to copious blood.  There is no visible discharge however visualization is prevented due to the bleeding.  There is no cervical motion tenderness. Past Medical History:  Diagnosis Date  . Allergy    latex and codeine  . Anxiety    on lexapro  . Heavy smoker (more than 20 cigarettes per day) 08/06/2011  . Vaginal Pap smear, abnormal    LGSIL with HPV 2019 -colpo done   No past surgical history on file. Current Outpatient Medications on File Prior to Visit  Medication Sig Dispense Refill  . escitalopram (LEXAPRO) 20 MG tablet Take 1 tablet by mouth once daily 90 tablet 0  . triamcinolone cream (KENALOG) 0.1 % Apply 1 application topically 2 (two) times daily. 30 g 0   No current facility-administered medications on file prior to visit.   Allergies  Allergen Reactions  . Latex   . Augmentin [Amoxicillin-Pot Clavulanate]     GI Upset  . Codeine    Social History   Socioeconomic History  . Marital status: Single    Spouse name: Not on file  . Number of children: Not on file  . Years of education: Not on file  . Highest education level: Not on file  Occupational History  . Not on file  Tobacco Use  . Smoking status: Current Every Day Smoker    Packs/day: 1.00    Years: 15.00    Pack years: 15.00    Types: Cigarettes  . Smokeless tobacco: Never  Used  . Tobacco comment: trying to cut back and quit  Substance and Sexual Activity  . Alcohol use: Yes    Comment: rarely  . Drug use: No  . Sexual activity: Yes    Birth control/protection: None  Other Topics Concern  . Not on file  Social History Narrative  . Not on file   Social Determinants of Health   Financial Resource Strain:   . Difficulty of Paying Living Expenses: Not on file  Food Insecurity:   . Worried About Programme researcher, broadcasting/film/video in the Last Year: Not on file  . Ran Out of Food in the Last Year: Not on file  Transportation Needs:   . Lack of Transportation (Medical): Not on file  . Lack of Transportation (Non-Medical): Not on file  Physical Activity:   . Days of Exercise per Week: Not on file  . Minutes of Exercise per Session: Not on file  Stress:   . Feeling of Stress : Not on file  Social Connections:   . Frequency of Communication with Friends and Family: Not on file  . Frequency of Social Gatherings with Friends and Family: Not on file  . Attends Religious Services: Not on file  . Active Member of Clubs or Organizations: Not on file  .  Attends Banker Meetings: Not on file  . Marital Status: Not on file  Intimate Partner Violence:   . Fear of Current or Ex-Partner: Not on file  . Emotionally Abused: Not on file  . Physically Abused: Not on file  . Sexually Abused: Not on file      Review of Systems  All other systems reviewed and are negative.      Objective:   Physical Exam Vitals reviewed.  Constitutional:      General: She is not in acute distress.    Appearance: Normal appearance. She is not ill-appearing or toxic-appearing.  HENT:     Right Ear: Tympanic membrane and ear canal normal.     Left Ear: Tympanic membrane and ear canal normal.     Nose: Nose normal.  Eyes:     Extraocular Movements: Extraocular movements intact.     Conjunctiva/sclera: Conjunctivae normal.     Pupils: Pupils are equal, round, and reactive to  light.  Cardiovascular:     Rate and Rhythm: Normal rate and regular rhythm.     Heart sounds: Normal heart sounds.  Pulmonary:     Effort: Pulmonary effort is normal.     Breath sounds: Normal breath sounds.  Genitourinary:    Labia:        Right: Rash present.        Left: Rash present.      Vagina: Bleeding present.     Cervix: Cervical bleeding present. No cervical motion tenderness, friability or erythema.     Uterus: Normal.     Neurological:     General: No focal deficit present.     Mental Status: She is alert and oriented to person, place, and time.     Cranial Nerves: No cranial nerve deficit.     Sensory: No sensory deficit.     Motor: No weakness.     Coordination: Coordination normal.     Gait: Gait normal.           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  Begin Diflucan 150 mg p.o. x1 coupled with nystatin cream 3 times daily for 1 week.  If not improving, this may be an atypical presentation for psoriasis.  The next step would be to try a steroid cream such as triamcinolone on the areas diagrammed above.  Patient will call me next week with an update of whether the rash is improving

## 2020-05-15 NOTE — Addendum Note (Signed)
Addended by: Roxy Cedar on: 05/15/2020 02:37 PM   Modules accepted: Orders

## 2020-05-15 NOTE — Addendum Note (Signed)
Addended by: Lynnea Ferrier T on: 05/15/2020 10:04 AM   Modules accepted: Orders

## 2020-05-16 LAB — C. TRACHOMATIS/N. GONORRHOEAE RNA
C. trachomatis RNA, TMA: NOT DETECTED
N. gonorrhoeae RNA, TMA: NOT DETECTED

## 2020-05-19 NOTE — Telephone Encounter (Signed)
Rx sent to pharmacy of Pt request

## 2020-05-23 ENCOUNTER — Ambulatory Visit: Payer: BC Managed Care – PPO | Admitting: Obstetrics & Gynecology

## 2020-06-06 ENCOUNTER — Ambulatory Visit: Payer: Self-pay | Admitting: Obstetrics & Gynecology

## 2020-06-09 ENCOUNTER — Other Ambulatory Visit: Payer: Self-pay | Admitting: Family Medicine

## 2020-06-09 DIAGNOSIS — F419 Anxiety disorder, unspecified: Secondary | ICD-10-CM

## 2020-06-12 ENCOUNTER — Ambulatory Visit (INDEPENDENT_AMBULATORY_CARE_PROVIDER_SITE_OTHER): Payer: 59 | Admitting: Obstetrics & Gynecology

## 2020-06-12 ENCOUNTER — Encounter: Payer: Self-pay | Admitting: Obstetrics & Gynecology

## 2020-06-12 ENCOUNTER — Other Ambulatory Visit (HOSPITAL_COMMUNITY)
Admission: RE | Admit: 2020-06-12 | Discharge: 2020-06-12 | Disposition: A | Discharge status: Home / Self Care | Payer: 59 | Source: Ambulatory Visit | Attending: Obstetrics & Gynecology | Admitting: Obstetrics & Gynecology

## 2020-06-12 ENCOUNTER — Other Ambulatory Visit: Payer: Self-pay

## 2020-06-12 VITALS — BP 109/74 | HR 73 | Ht 62.0 in | Wt 121.0 lb

## 2020-06-12 DIAGNOSIS — Z113 Encounter for screening for infections with a predominantly sexual mode of transmission: Secondary | ICD-10-CM | POA: Diagnosis not present

## 2020-06-12 DIAGNOSIS — Z01419 Encounter for gynecological examination (general) (routine) without abnormal findings: Secondary | ICD-10-CM | POA: Diagnosis present

## 2020-06-12 NOTE — Progress Notes (Signed)
Subjective:     Angela [name] is a 44 y.o. female here for a routine exam.  Current complaints: Pt with new sexual partner since May. 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  Sexually active. No consistent contraception.  Reports tob use.       Gynecologic History Patient's last menstrual period was 06/09/2020 (exact date). Contraception: condoms sometimes Last Pap: 03/12/2019. Results were: neg PAP with +hrHPV Last mammogram: 04/29/2020 birad 1  Obstetric History OB History  Gravida Para Term Preterm AB Living  0 0 0 0 0 0  SAB TAB Ectopic Multiple Live Births  0 0 0 0     The following portions of the patient's history were reviewed and updated as appropriate: allergies, current medications, past family history, past medical history, past social history, past surgical history and problem list.  Review of Systems Pertinent items are noted in HPI.    Objective:  BP 109/74   Pulse 73   Ht 5\' 2"  (1.575 m)   Wt 121 lb (54.9 kg)   LMP 06/09/2020 (Exact Date)   BMI 22.13 kg/m  General Appearance:    Alert, cooperative, no distress, appears stated age  Head:    Normocephalic, without obvious abnormality, atraumatic  Eyes:    conjunctiva/corneas clear, EOM's intact, both eyes  Ears:    Normal external ear canals, both ears  Nose:   Nares normal, septum midline, mucosa normal, no drainage    or sinus tenderness  Throat:   Lips, mucosa, and tongue normal; teeth and gums normal  Neck:   Supple, symmetrical, trachea midline, no adenopathy;    thyroid:  no enlargement/tenderness/nodules  Back:     Symmetric, no curvature, ROM normal, no CVA tenderness  Lungs:     respirations unlabored  Chest Wall:    No tenderness or deformity   Heart:    Regular rate and rhythm  Breast Exam:    No tenderness, masses, or nipple abnormality  Abdomen:     Soft, non-tender, bowel sounds active all four quadrants,    no masses, no organomegaly  Genitalia:    Normal female without lesion,  discharge or tenderness   Uterus small and mobile. NO adnexal masses.   Extremities:   Extremities normal, atraumatic, no cyanosis or edema  Pulses:   2+ and symmetric all extremities  Skin:   Skin color, texture, turgor normal, no rashes or lesions     Assessment:    Healthy female exam.   STI screen Contraception counseling- declines contraception at this point. Understands that she could get pregnant.    Plan:     Diagnoses and all orders for this visit:  Well woman exam with routine gynecological exam -     Cytology - PAP( [place]) -     HIV antibody (with reflex) -     Hepatitis C Antibody -     Hepatitis B Surface AntiGEN -     RPR  Routine screening for STI (sexually transmitted infection)  f/u in 1 year or sooner prn   Sloane Junkin L. Harraway-Smith, M.D., 06/11/2020

## 2020-06-12 NOTE — Progress Notes (Signed)
Mammogram done 04-29-2020

## 2020-06-13 LAB — RPR: RPR Ser Ql: NONREACTIVE

## 2020-06-13 LAB — HEPATITIS B SURFACE ANTIGEN: Hepatitis B Surface Ag: NEGATIVE

## 2020-06-13 LAB — HEPATITIS C ANTIBODY: Hep C Virus Ab: <0.1 s/co ratio (ref 0.0–0.9)

## 2020-06-13 LAB — HIV ANTIBODY (ROUTINE TESTING W REFLEX): HIV Screen 4th Generation wRfx: NONREACTIVE

## 2020-06-17 LAB — CYTOLOGY - PAP
Comment: NEGATIVE
Comment: NEGATIVE
HPV 16: NEGATIVE
HPV 18 / 45: NEGATIVE
High risk HPV: POSITIVE — AB

## 2020-06-18 ENCOUNTER — Telehealth: Payer: Self-pay

## 2020-06-18 NOTE — Telephone Encounter (Signed)
Pt called wanting results of Pap Smear. Per Dr. Burnice Logan Smith's note pap smear shows negative STI screen and LSIL on Pap. Dr. Burnice Logan advises Colpo.  Pt voiced understanding. Pt will be scheduled for Colpo after looking at her personal schedule and speaking with Dr. Burnice Logan.

## 2020-07-23 ENCOUNTER — Encounter: Payer: 59 | Admitting: Obstetrics & Gynecology

## 2020-08-04 ENCOUNTER — Encounter: Payer: Self-pay | Admitting: Obstetrics & Gynecology

## 2020-08-04 ENCOUNTER — Other Ambulatory Visit (HOSPITAL_COMMUNITY)
Admission: RE | Admit: 2020-08-04 | Discharge: 2020-08-04 | Disposition: A | Discharge status: Home / Self Care | Payer: 59 | Source: Ambulatory Visit | Attending: Obstetrics & Gynecology | Admitting: Obstetrics & Gynecology

## 2020-08-04 ENCOUNTER — Ambulatory Visit (INDEPENDENT_AMBULATORY_CARE_PROVIDER_SITE_OTHER): Payer: 59 | Admitting: Obstetrics & Gynecology

## 2020-08-04 ENCOUNTER — Other Ambulatory Visit: Payer: Self-pay

## 2020-08-04 VITALS — BP 94/60 | HR 70 | Wt 123.0 lb

## 2020-08-04 DIAGNOSIS — R87612 Low grade squamous intraepithelial lesion on cytologic smear of cervix (LGSIL): Secondary | ICD-10-CM

## 2020-08-04 NOTE — Progress Notes (Signed)
Patient given informed consent, signed copy in the chart, time out was performed.  Placed in lithotomy position. Cervix viewed with speculum and colposcope after application of acetic acid.  06/12/2020    High risk HPV PositiveAbnormal   HPV 16 Negative   HPV 18 / 45 Negative   Adequacy Satisfactory for evaluation; transformation zone component PRESENT.   Diagnosis - Low grade squamous intraepithelial lesion (LSIL)Abnormal   Comment Normal Reference Range HPV - Negative   Comment Normal Reference Range HPV 16 18 45 -Negative    Colposcopy adequate?  yes Acetowhite lesions? yes Punctation? no Mosaicism?  no Abnormal vasculature? no Biopsies? yes ECC? yes   I suspect LGSIL. Patient was given post procedure instructions.  She will return in 2 weeks for results if needed or will connect by telephone. She understands that if there is only LGSIL, we will f/u in 1 year.   Annella Prowell L. Harraway-Smith, M.D., [facility]

## 2020-08-05 LAB — SURGICAL PATHOLOGY

## 2020-08-05 NOTE — Patient Instructions (Signed)
Colposcopy Colposcopy is a procedure to examine the lowest part of the uterus (cervix), the vagina, and the area around the vaginal opening (vulva) for abnormalities or signs of disease. The procedure is done using a lighted microscope or magnifying lens (colposcope). If any unusual cells are found during the procedure, your health care provider may remove a tissue sample for testing (biopsy). A colposcopy may be done if you:  Have an abnormal Pap test. A Pap test is a screening test that is used to check for signs of cancer or infection of the vagina, cervix, and uterus.  Have a Pap smear test in which you test positive for high-risk HPV (human papillomavirus).  Have a sore or lesion on your cervix.  Have genital warts on your vulva, vagina, or cervix.  Took certain medicines while pregnant, such as diethylstilbestrol (DES).  Have pain during sexual intercourse.  Have vaginal bleeding, especially after sexual intercourse.  Need to have a cervical polyp removed.  Need to have a lost intrauterine device (IUD) string located. Let your health care provider know about:  Any allergies you have, including allergies to prescribed medicine, latex, or iodine.  All medicines you are taking, including vitamins, herbs, eye drops, creams, and over-the-counter medicines. Bring a list of all of your medicines to your appointment.  Any problems you or family members have had with anesthetic medicines.  Any blood disorders you have.  Any surgeries you have had.  Any medical conditions you have, such as pelvic inflammatory disease (PID) or endometrial disorder.  Any history of frequent fainting.  Your menstrual cycle and what form of birth control (contraception) you use.  Your medical history, including any prior cervical treatment.  Whether you are pregnant or may be pregnant. What are the risks? Generally, this is a safe procedure. However, problems may occur,  including:  Pain.  Infection, which may include a fever, bad-smelling discharge, or pelvic pain.  Bleeding or discharge.  Misdiagnosis.  Fainting and vasovagal reactions, but this is rare.  Allergic reactions to medicines.  Damage to other structures or organs. What happens before the procedure?  If you have your menstrual period or will have it at the time of your procedure, tell your health care provider. A colposcopy typically is not done during menstruation.  Continue your contraceptive practices before and after the procedure.  For 24 hours before the colposcopy: ? Do not douche. ? Do not use tampons. ? Do not use medicines, creams, or suppositories in the vagina. ? Do not have sexual intercourse.  Ask your health care provider about: ? Changing or stopping your regular medicines. This is especially important if you are taking diabetes medicines or blood thinners. ? Taking medicines such as aspirin and ibuprofen. These medicines can thin your blood. Do not take these medicines before your procedure if your health care provider instructs you not to. It is likely that your health care provider will tell you to avoid taking aspirin or medicine that contains aspirin for 7 days before the procedure.  Follow instructions from your health care provider about eating or drinking restrictions. You will likely need to eat a regular diet the day of the procedure and not skip any meals.  You may have an exam or testing. A pregnancy test will be taken on the day of the procedure.  You may have a blood or urine sample taken.  Plan to have someone take you home from the hospital or clinic.  If you will be going  home right after the procedure, plan to have someone with you for 24 hours. What happens during the procedure?  You will lie down on your back, with your feet in foot rests (stirrups).  A warmed and lubricated instrument (speculum) will be inserted into your vagina. The speculum will be used to hold  apart the walls of your vagina so your health care provider can see your cervix and the inside of your vagina.  A cotton swab will be used to place a small amount of liquid solution on the areas to be examined. This solution makes it easier to see abnormal cells. You may feel a slight burning during this part.  The colposcope will be used to scan the cervix with a bright white light. The colposcope will be held near your vulvaand will magnify your vulva, vagina, and cervix for easier examination.  Your health care provider may decide to take a biopsy. If so: ? You may be given medicine to numb the area (local anesthetic). ? Surgical instruments will be used to suck out mucus and cells through your vagina. ? You may feel mild pain while the tissue sample is removed. ? Bleeding may occur. A solution may be used to stop the bleeding. ? If a sample of tissue is needed from the inside of the cervix, a different procedure called endocervical curettage (ECC) may be completed. During this procedure, a curved instrument (curette) will be used to scrape cells from your cervix or the top of your cervix (endocervix).  Your health care provider will record the location of any abnormalities. The procedure may vary among health care providers and hospitals. What happens after the procedure?  You will lie down and rest for a few minutes. You may be offered juice or cookies.  Your blood pressure, heart rate, breathing rate, and blood oxygen level will be monitored until any medicines you were given have worn off.  You may have to wear compression stockings. These stockings help to prevent blood clots and reduce swelling in your legs.  You may have some cramping in your abdomen. This should go away after a few minutes. This information is not intended to replace advice given to you by your health care provider. Make sure you discuss any questions you have with your health care provider. Document Revised:  07/22/2017 Document Reviewed: 03/15/2016 Elsevier Patient Education  2020 [employer] Colposcopy, Care After This sheet gives you information about how to care for yourself after your procedure. Your doctor may also give you more specific instructions. If you have problems or questions, contact your doctor. What can I expect after the procedure? If you did not have a tissue sample removed (did not have a biopsy), you may only have some spotting for a few days. You can go back to your normal activities. If you had a tissue sample removed, it is common to have:  Soreness and pain. This may last for a few days.  Light-headedness.  Mild bleeding from your vagina or dark-colored, grainy discharge from your vagina. This may last for a few days. You may need to wear a sanitary pad.  Spotting for at least 48 hours after the procedure. Follow these instructions at home:   Take over-the-counter and prescription medicines only as told by your doctor. Ask your doctor what medicines you can start taking again. This is very important if you take blood-thinning medicine.  Do not drive or use heavy machinery while taking prescription pain medicine.  For 3   days, or as long as your doctor tells you, avoid: ? Douching. ? Using tampons. ? Having sex.  If you use birth control (contraception), keep using it.  Limit activity for the first day after the procedure. Ask your doctor what activities are safe for you.  It is up to you to get the results of your procedure. Ask your doctor when your results will be ready.  Keep all follow-up visits as told by your doctor. This is important. Contact a doctor if:  You get a skin rash. Get help right away if:  You are bleeding a lot from your vagina. It is a lot of bleeding if you are using more than one pad an hour for 2 hours in a row.  You have clumps of blood (blood clots) coming from your vagina.  You have a  fever.  You have chills  You have pain in your lower belly (pelvic area).  You have signs of infection, such as vaginal discharge that is: ? Different than usual. ? Yellow. ? Bad-smelling.  You have very pain or cramps in your lower belly that do not get better with medicine.  You feel light-headed.  You feel dizzy.  You pass out (faint). Summary  If you did not have a tissue sample removed (did not have a biopsy), you may only have some spotting for a few days. You can go back to your normal activities.  If you had a tissue sample removed, it is common to have mild pain and spotting for 48 hours.  For 3 days, or as long as your doctor tells you, avoid douching, using tampons and having sex.  Get help right away if you have bleeding, very bad pain, or signs of infection. This information is not intended to replace advice given to you by your health care provider. Make sure you discuss any questions you have with your health care provider. Document Revised: 07/22/2017 Document Reviewed: 04/28/2016 Elsevier Patient Education  2020 [employer].

## 2020-08-21 ENCOUNTER — Encounter: Payer: 59 | Admitting: Obstetrics & Gynecology

## 2020-08-27 ENCOUNTER — Encounter: Payer: Self-pay | Admitting: Nurse Practitioner

## 2020-08-27 ENCOUNTER — Ambulatory Visit (INDEPENDENT_AMBULATORY_CARE_PROVIDER_SITE_OTHER): Payer: 59 | Admitting: Nurse Practitioner

## 2020-08-27 ENCOUNTER — Other Ambulatory Visit: Payer: Self-pay

## 2020-08-27 VITALS — HR 96

## 2020-08-27 DIAGNOSIS — J069 Acute upper respiratory infection, unspecified: Secondary | ICD-10-CM

## 2020-08-27 HISTORY — DX: Acute upper respiratory infection, unspecified: J06.9

## 2020-08-27 NOTE — Progress Notes (Signed)
Subjective:    Patient ID: Angela [name], female    DOB: 06/02/1976, 45 y.o.   MRN: [id number]  HPI: Angela [name] is a 45 y.o. female presenting via parking lot visit due to COVID-19 pandemic for upper respiratory infection symptoms.  Chief Complaint  Patient presents with  . URI   UPPER RESPIRATORY TRACT INFECTION Onset: 12/31/202 Fever: no Cough: no Shortness of breath: no Wheezing: no Chest pain: no Chest tightness: no Chest congestion: no Nasal congestion: no Runny nose: no Post nasal drip: no Sneezing: no Sore throat: no Swollen glands: no Sinus pressure: yes Headache: yes Face pain: no Toothache: no Ear pain: yes  Ear pressure: no  Eyes red/itching:no Eye drainage/crusting: no  Nausea: no Vomiting: no Diarrhea: yes Change in appetite: no Loss of taste/smell: no Rash: no Fatigue: yes Sick contacts: yes; co-workers Strep contacts: no  Context: stable Recurrent sinusitis: no Treatments attempted: flonase, Tylenol, Vitamin C  Relief with OTC medications: yes  Allergies  Allergen Reactions  . Latex   . Augmentin [Amoxicillin-Pot Clavulanate]     GI Upset  . Codeine     Outpatient Encounter Medications as of 08/27/2020  Medication Sig  . betamethasone dipropionate 0.05 % cream Apply topically 2 (two) times daily.  Marland Kitchen escitalopram (LEXAPRO) 20 MG tablet Take 1 tablet by mouth once daily  . nystatin cream (MYCOSTATIN) Apply 1 application topically 3 (three) times daily. (Patient not taking: Reported on 08/04/2020)  . triamcinolone cream (KENALOG) 0.1 % Apply 1 application topically 2 (two) times daily.   No facility-administered encounter medications on file as of 08/27/2020.    Patient Active Problem List   Diagnosis Date Noted  . Upper respiratory tract infection 08/27/2020  . Abnormal Pap smear of cervix 03/26/2018  . Anxiety   . Heavy smoker (more than 20 cigarettes per day) 08/06/2011    Past Medical History:   Diagnosis Date  . Allergy    latex and codeine  . Anxiety    on lexapro  . Heavy smoker (more than 20 cigarettes per day) 08/06/2011  . Vaginal Pap smear, abnormal    LGSIL with HPV 2019 -colpo done    Relevant past medical, surgical, family and social history reviewed and updated as indicated. Interim medical history since our last visit reviewed.  Review of Systems  Constitutional: Positive for fatigue. Negative for activity change, appetite change and fever.  HENT: Positive for sinus pressure. Negative for congestion, ear pain, postnasal drip, rhinorrhea, sinus pain, sneezing and sore throat.   Eyes: Negative.  Negative for pain, discharge, redness and itching.  Respiratory: Negative.  Negative for cough, chest tightness, shortness of breath and wheezing.   Cardiovascular: Negative.  Negative for chest pain.  Gastrointestinal: Positive for diarrhea. Negative for constipation, nausea and vomiting.  Musculoskeletal: Negative.   Skin: Negative.  Negative for rash.  Neurological: Positive for headaches. Negative for dizziness and weakness.  Psychiatric/Behavioral: Negative.  Negative for confusion.    Per HPI unless specifically indicated above     Objective:    There were no vitals taken for this visit.  Wt Readings from Last 3 Encounters:  08/04/20 123 lb (55.8 kg)  06/12/20 121 lb (54.9 kg)  05/15/20 120 lb (54.4 kg)    Physical Exam Vitals and nursing note reviewed.  Constitutional:      General: She is not in acute distress.    Appearance: Normal appearance. She is not toxic-appearing.  HENT:     Head: Normocephalic and  atraumatic.     Right Ear: External ear normal.     Nose: Nose normal. No congestion.     Mouth/Throat:     Mouth: Mucous membranes are moist.     Pharynx: Oropharynx is clear. No oropharyngeal exudate.  Eyes:     General: No scleral icterus.    Extraocular Movements: Extraocular movements intact.  Cardiovascular:     Rate and Rhythm: Normal  rate.  Pulmonary:     Effort: Pulmonary effort is normal. No respiratory distress.  Abdominal:     General: Abdomen is flat. There is no distension.     Palpations: Abdomen is soft.  Musculoskeletal:     Cervical back: Normal range of motion.  Lymphadenopathy:     Cervical: No cervical adenopathy.  Skin:    General: Skin is dry.     Capillary Refill: Capillary refill takes less than 2 seconds.     Coloration: Skin is not jaundiced or pale.     Findings: No erythema.  Neurological:     Mental Status: She is alert and oriented to person, place, and time.  Psychiatric:        Mood and Affect: Mood normal.        Behavior: Behavior normal.        Thought Content: Thought content normal.        Judgment: Judgment normal.        Assessment & Plan:   Problem List Items Addressed This Visit      Respiratory   Upper respiratory tract infection - Primary    Acute, ongoing x 1 day.  COVID swab obtain.  Reassured patient that symptoms and exam findings are most consistent with a viral upper respiratory infection and explained lack of efficacy of antibiotics against viruses.  Discussed expected course and features suggestive of secondary bacterial infection.  Continue supportive care. Increase fluid intake with water or electrolyte solution like pedialyte. Encouraged acetaminophen as needed for fever/pain. Encouraged salt water gargling, chloraseptic spray and throat lozenges. Encouraged OTC guaifenesin. Encouraged saline sinus flushes and/or neti with humidified air.  If symptoms persist >10 days, return to clinic for symptom re-evaluation.  With any sudden onset new chest pain, dizziness, sweating, or shortness of breath, go to ED.       Relevant Orders   SARS-COV-2 RNA,(COVID-19) QUAL NAAT       Follow up plan: Return if symptoms worsen or fail to improve.

## 2020-08-27 NOTE — Patient Instructions (Signed)
10 Things You Can Do to Manage Your COVID-19 Symptoms at Home If you have possible or confirmed COVID-19: 1. Stay home from work and school. And stay away from other public places. If you must go out, avoid using any kind of public transportation, ridesharing, or taxis. 2. Monitor your symptoms carefully. If your symptoms get worse, call your healthcare provider immediately. 3. Get rest and stay hydrated. 4. If you have a medical appointment, call the healthcare provider ahead of time and tell them that you have or may have COVID-19. 5. For medical emergencies, call 911 and notify the dispatch personnel that you have or may have COVID-19. 6. Cover your cough and sneezes with a tissue or use the inside of your elbow. 7. Wash your hands often with soap and water for at least 20 seconds or clean your hands with an alcohol-based hand sanitizer that contains at least 60% alcohol. 8. As much as possible, stay in a specific room and away from other people in your home. Also, you should use a separate bathroom, if available. If you need to be around other people in or outside of the home, wear a mask. 9. Avoid sharing personal items with other people in your household, like dishes, towels, and bedding. 10. Clean all surfaces that are touched often, like counters, tabletops, and doorknobs. Use household cleaning sprays or wipes according to the label instructions. cdc.gov/coronavirus 02/21/2019 This information is not intended to replace advice given to you by your health care provider. Make sure you discuss any questions you have with your health care provider. Document Revised: 07/26/2019 Document Reviewed: 07/26/2019 Elsevier Patient Education  2020 [employer]  

## 2020-08-27 NOTE — Assessment & Plan Note (Signed)
Acute, ongoing x 1 day.  COVID swab obtain.  Reassured patient that symptoms and exam findings are most consistent with a viral upper respiratory infection and explained lack of efficacy of antibiotics against viruses.  Discussed expected course and features suggestive of secondary bacterial infection.  Continue supportive care. Increase fluid intake with water or electrolyte solution like pedialyte. Encouraged acetaminophen as needed for fever/pain. Encouraged salt water gargling, chloraseptic spray and throat lozenges. Encouraged OTC guaifenesin. Encouraged saline sinus flushes and/or neti with humidified air.  If symptoms persist >10 days, return to clinic for symptom re-evaluation.  With any sudden onset new chest pain, dizziness, sweating, or shortness of breath, go to ED.

## 2020-08-29 LAB — SARS-COV-2 RNA,(COVID-19) QUALITATIVE NAAT: SARS CoV2 RNA: DETECTED — AB

## 2020-09-02 ENCOUNTER — Other Ambulatory Visit: Payer: Self-pay

## 2020-09-02 ENCOUNTER — Telehealth: Payer: Self-pay | Admitting: *Deleted

## 2020-09-02 ENCOUNTER — Telehealth (INDEPENDENT_AMBULATORY_CARE_PROVIDER_SITE_OTHER): Payer: 59 | Admitting: Family Medicine

## 2020-09-02 DIAGNOSIS — U071 COVID-19: Secondary | ICD-10-CM

## 2020-09-02 MED ORDER — CLONAZEPAM 0.5 MG PO TABS: 0.2500 mg | ORAL_TABLET | Freq: Two times a day (BID) | ORAL | 0 refills | Status: DC | PRN

## 2020-09-02 MED ORDER — PREDNISONE 20 MG PO TABS: ORAL_TABLET | ORAL | 0 refills | Status: DC

## 2020-09-02 NOTE — Progress Notes (Signed)
Subjective:    Patient ID: Angela [name], female    DOB: 21-Dec-1975, 45 y.o.   MRN: [id number]  HPI  Patient is being seen today as a telephone visit.  She consents to be seen via telephone.  She is currently at home.  I am currently in my office.  Phone call began at 3:00.  Phone call concluded at 327.  The patient tested positive for COVID on Friday.  Saturday morning she developed significant pain and pressure in her sinuses per tickly behind her eyes.  She lost her sense of smell and taste on Saturday.  She reports fatigue.  She reports thick copious rhinorrhea and head congestion and sinus pressure and sinus pain.  She has been taking Tylenol.  She has been taking Sudafed.  She has been using hot steam.  She has been using nasal sprays.  Nothing is helping the sinus pressure.  She reports aching pain in her neck and shoulders.  She denies any chest pain.  She denies any shortness of breath.  She denies any wheezing.  However she admits that she believes the whole situation with the virus has really exacerbated her anxiety.  She states that this morning she simply broke down and cried talking to her mom.  She feels overwhelmed at times.  She is having trouble sleeping.  In the past she took Xanax, but she did not like the way it made her feel.  She is hesitant to take any medication that makes her sleepy.  She is on Lexapro 10 mg a day.  She states that her menstrual cycle also started today and she believes that this is got "her emotions on edge". Past Medical History:  Diagnosis Date  . Allergy    latex and codeine  . Anxiety    on lexapro  . Heavy smoker (more than 20 cigarettes per day) 08/06/2011  . Vaginal Pap smear, abnormal    LGSIL with HPV 2019 -colpo done   No past surgical history on file. Current Outpatient Medications on File Prior to Visit  Medication Sig Dispense Refill  . escitalopram (LEXAPRO) 20 MG tablet Take 1 tablet by mouth once daily (Patient taking  differently: 10 mg.) 90 tablet 0  . betamethasone dipropionate 0.05 % cream Apply topically 2 (two) times daily.     No current facility-administered medications on file prior to visit.   Allergies  Allergen Reactions  . Latex   . Augmentin [Amoxicillin-Pot Clavulanate]     GI Upset  . Codeine    Social History   Socioeconomic History  . Marital status: Single    Spouse name: Not on file  . Number of children: Not on file  . Years of education: Not on file  . Highest education level: Not on file  Occupational History  . Not on file  Tobacco Use  . Smoking status: Current Every Day Smoker    Packs/day: 1.00    Years: 15.00    Pack years: 15.00    Types: Cigarettes  . Smokeless tobacco: Never Used  . Tobacco comment: trying to cut back and quit  Substance and Sexual Activity  . Alcohol use: Yes    Comment: rarely  . Drug use: No  . Sexual activity: Yes    Birth control/protection: None  Other Topics Concern  . Not on file  Social History Narrative  . Not on file   Social Determinants of Health   Financial Resource Strain: Not on file  Food  Insecurity: Not on file  Transportation Needs: Not on file  Physical Activity: Not on file  Stress: Not on file  Social Connections: Not on file  Intimate Partner Violence: Not on file     Review of Systems  All other systems reviewed and are negative.      Objective:   Physical Exam        Assessment & Plan:  COVID-19  Unfortunately, the patient does have a sinus infection but I explained to her as a sinus infection due to COVID not due to a bacteria.  Therefore unfortunately it has to run its course.  I recommended continuing Tylenol and alternating with ibuprofen.  She can also continue to use Sudafed.  If she develops chest pain or shortness of breath she should go to the emergency room.  However I will give her Klonopin 0.5 mg tablets.  She can take 1/2 to 1 tablet every 12 hours as needed for severe anxiety as  I believe anxiety is exacerbating her symptoms.  If the sinus pressure last more than 10 days I would treat her with an antibiotic for possibly a bacterial sinus infection however I believe that she would benefit more from prednisone in particular given her history of smoking and perhaps some mild wheezing when she coughs per her report.  Therefore I did give her a prednisone taper pack but she is going to hold off on using that at the present time because she is worried about that exacerbating her anxiety.  I stated that she should use the prednisone if she starts to wheeze or if the sinus pressure gets worse

## 2020-09-02 NOTE — Telephone Encounter (Signed)
Received call from patient.   Reports that she was diagnosed with COVID on 08/28/2020. States that she continues to have extreme sinus pressure and HA.   States that she has been using humidifier, nasal saline, sudafed, and APAP.   Appointment scheduled to evaluate via Telehealth.

## 2020-09-03 ENCOUNTER — Telehealth: Payer: Self-pay

## 2020-09-03 NOTE — Telephone Encounter (Signed)
Sent note to patient Provider about irritating eye.

## 2020-09-03 NOTE — Telephone Encounter (Signed)
Mrs. Cottone called about her eyes still irritated.( Left eye) dry, red, irritated, watery

## 2020-09-04 ENCOUNTER — Other Ambulatory Visit: Payer: Self-pay | Admitting: Family Medicine

## 2020-09-04 MED ORDER — POLYMYXIN B-TRIMETHOPRIM 10000-0.1 UNIT/ML-% OP SOLN: 2.0000 [drp] | OPHTHALMIC | 0 refills | Status: DC

## 2020-09-04 MED ORDER — AMOXICILLIN 875 MG PO TABS: 875.0000 mg | ORAL_TABLET | Freq: Two times a day (BID) | ORAL | 0 refills | Status: DC

## 2020-09-04 NOTE — Telephone Encounter (Signed)
Call placed to patient and patient made aware.   Patient declines prednisone taper as she is concerned it will make her anxiety worse. Advised that prednisone is the standard for treatment as it will decrease inflammation in sinuses. Patient continued to decline.   Advised to continue Sx management and if no improvement noted on Monday, ABTx could be sent in. Patient requested to start ABTx on Friday so that she may return to work on Monday.   Please advise.

## 2020-09-04 NOTE — Telephone Encounter (Signed)
I will call out eye drops.

## 2020-09-04 NOTE — Telephone Encounter (Signed)
I do not feel she has symptoms of meningitis.  Everything else sounds like its due to her sinuses.  She should try the prednisone I rx'd Tuesday.  If she has, we can try amoxicillin for a secondary bacterial sinus infection.

## 2020-09-04 NOTE — Telephone Encounter (Signed)
Patient reports that her nasal drainage has changed to yellow to green colored. Reports that she continues to have pressure in ear and muffled hearing in ear.   States that she is currently on day 9 of illness.   Also reports that she had co-worker who has viral meningitis. States that she was near co-worker on New Years. States that she does have neck pain/ stiffness, but pain is chronic.   Please advise.

## 2020-09-04 NOTE — Telephone Encounter (Signed)
Call placed to patient and patient made aware.  

## 2020-09-09 ENCOUNTER — Encounter: Payer: Self-pay | Admitting: Family Medicine

## 2020-09-09 ENCOUNTER — Telehealth: Payer: Self-pay | Admitting: Family Medicine

## 2020-09-09 NOTE — Telephone Encounter (Signed)
Patient needs a note to return to work. She has been out of work since 08/27/2020 due to Covid. She would like this note put in mychart so she can print it out.  CB# [phone]

## 2020-09-09 NOTE — Telephone Encounter (Signed)
Letter is in her chart

## 2020-10-16 ENCOUNTER — Other Ambulatory Visit: Payer: Self-pay

## 2020-10-16 ENCOUNTER — Encounter: Payer: Self-pay | Admitting: Family Medicine

## 2020-10-16 ENCOUNTER — Ambulatory Visit (INDEPENDENT_AMBULATORY_CARE_PROVIDER_SITE_OTHER): Payer: 59 | Admitting: Family Medicine

## 2020-10-16 VITALS — BP 108/64 | HR 73 | Temp 97.7°F | Ht 62.0 in | Wt 123.0 lb

## 2020-10-16 DIAGNOSIS — F410 Panic disorder [episodic paroxysmal anxiety] without agoraphobia: Secondary | ICD-10-CM

## 2020-10-16 DIAGNOSIS — F411 Generalized anxiety disorder: Secondary | ICD-10-CM | POA: Diagnosis not present

## 2020-10-16 MED ORDER — CLONAZEPAM 0.5 MG PO TABS: 0.2500 mg | ORAL_TABLET | Freq: Two times a day (BID) | ORAL | 0 refills | Status: DC | PRN

## 2020-10-16 NOTE — Progress Notes (Signed)
Subjective:    Patient ID: Angela [name], female    DOB: Mar 16, 1976, 45 y.o.   MRN: [id number]  HPI Patient is a very pleasant 45 year old Caucasian female who recently had COVID.  She states that she had been battling depression for quite some time and had been taking 10 mg of Lexapro every day.  However recently with having Covid, things seems to have spiraled out of control.  In addition to the depression she is suffering from panic attacks.  She states that she will suddenly start to feel "weird".  She will feel like something is wrong.  She cannot describe it she just feels strange all over.  She feels like she is going to die.  Symptoms were bad enough last week that she went to an urgent care where she was diagnosed with an ear infection and was started on amoxicillin.  She went back earlier this week and was switched to a Z-Pak for a sinus infection.  She does have some tenderness and pain in both frontal and both maxillary sinus areas as well as some eustachian tube dysfunction symptoms in the right ear.  However symptoms have been so bad, that she is asked her mother to spend the night with her because she is afraid to be alone.  These attacks occur where she feels like something is wrong and that she is going to die.  Symptoms became so bad last night, that she called her sister around 11:00 and they called 911.  She was evaluated by EMS.  Her vital signs were normal.  CBG was normal.  Pulse oximetry was excellent.  Blood pressure was normal.  Heart rate was normal.  Telemetry was normal.  She prompted today's visit Past Medical History:  Diagnosis Date  . Allergy    latex and codeine  . Anxiety    on lexapro  . Heavy smoker (more than 20 cigarettes per day) 08/06/2011  . Vaginal Pap smear, abnormal    LGSIL with HPV 2019 -colpo done   No past surgical history on file. Current Outpatient Medications on File Prior to Visit  Medication Sig Dispense Refill  . amoxicillin  (AMOXIL) 875 MG tablet Take 1 tablet (875 mg total) by mouth 2 (two) times daily. (Patient not taking: Reported on 10/16/2020) 20 tablet 0  . betamethasone dipropionate 0.05 % cream Apply topically 2 (two) times daily.    Marland Kitchen escitalopram (LEXAPRO) 20 MG tablet Take 1 tablet by mouth once daily (Patient taking differently: 10 mg.) 90 tablet 0  . trimethoprim-polymyxin b (POLYTRIM) ophthalmic solution Place 2 drops into the left eye every 4 (four) hours. 10 mL 0   No current facility-administered medications on file prior to visit.   Allergies  Allergen Reactions  . Latex   . Augmentin [Amoxicillin-Pot Clavulanate]     GI Upset  . Codeine    Social History   Socioeconomic History  . Marital status: Single    Spouse name: Not on file  . Number of children: Not on file  . Years of education: Not on file  . Highest education level: Not on file  Occupational History  . Not on file  Tobacco Use  . Smoking status: Current Every Day Smoker    Packs/day: 1.00    Years: 15.00    Pack years: 15.00    Types: Cigarettes  . Smokeless tobacco: Never Used  . Tobacco comment: trying to cut back and quit  Substance and Sexual Activity  . Alcohol  use: Yes    Comment: rarely  . Drug use: No  . Sexual activity: Yes    Birth control/protection: None  Other Topics Concern  . Not on file  Social History Narrative  . Not on file   Social Determinants of Health   Financial Resource Strain: Not on file  Food Insecurity: Not on file  Transportation Needs: Not on file  Physical Activity: Not on file  Stress: Not on file  Social Connections: Not on file  Intimate Partner Violence: Not on file      Review of Systems  All other systems reviewed and are negative.      Objective:   Physical Exam Constitutional:      General: She is not in acute distress.    Appearance: She is well-developed. She is not diaphoretic.  HENT:     Right Ear: External ear normal.     Left Ear: External ear  normal.     Nose: Nose normal.     Mouth/Throat:     Pharynx: No oropharyngeal exudate.  Eyes:     Conjunctiva/sclera: Conjunctivae normal.  Cardiovascular:     Rate and Rhythm: Normal rate and regular rhythm.     Heart sounds: Normal heart sounds. No murmur heard. No friction rub. No gallop.   Pulmonary:     Effort: Pulmonary effort is normal. No respiratory distress.     Breath sounds: Normal breath sounds. No wheezing or rales.  Abdominal:     General: Bowel sounds are normal.     Palpations: Abdomen is soft.  Musculoskeletal:     Cervical back: Neck supple.  Lymphadenopathy:     Cervical: No cervical adenopathy.           Assessment & Plan:  GAD (generalized anxiety disorder)  Panic attacks  Patient is certainly had COVID and even sounds like she may have a sinus infection however I believe the vast majority of her problems are due to anxiety.  I believe she is having panic attacks.  Increase Lexapro to 20 mg a day and start using Klonopin 0.5 mg twice daily on a scheduled basis.  I feel like if I can decrease the frequency and severity of the panic attacks that I will improve the patient's confidence.  As the frequency and severity of the panic attacks improves, I would then try to wean her away from the Klonopin.  However I believe the vast majority of her symptoms right now are due to fear of future panic attacks.

## 2020-11-03 ENCOUNTER — Other Ambulatory Visit: Payer: Self-pay

## 2020-11-03 ENCOUNTER — Encounter: Payer: Self-pay | Admitting: Family Medicine

## 2020-11-03 ENCOUNTER — Ambulatory Visit: Payer: 59 | Admitting: Family Medicine

## 2020-11-03 VITALS — BP 104/70 | HR 60 | Temp 96.6°F | Ht 62.0 in | Wt 121.8 lb

## 2020-11-03 DIAGNOSIS — B349 Viral infection, unspecified: Secondary | ICD-10-CM

## 2020-11-03 MED ORDER — ONDANSETRON HCL 4 MG PO TABS: 4.0000 mg | ORAL_TABLET | Freq: Three times a day (TID) | ORAL | 0 refills | Status: DC | PRN

## 2020-11-03 NOTE — Progress Notes (Signed)
Subjective:    Patient ID: Angela [name], female    DOB: [birth year], 45 y.o.   MRN: [id number]  Patient was at work today but started to feel ill and therefore she came home and made today's appointment.  She states that her symptoms began yesterday with diarrhea.  She also had some mild neck stiffness and a dull headache.  Today she developed a headache, light sensitivity, and some nausea.  She continues to have diarrhea.  She denies any cough.  She denies any sore throat.  She denies any shortness of breath.  She denies any abdominal pain.  She denies any rashes.  She does have some dry mouth. Past Medical History:  Diagnosis Date  . Allergy    latex and codeine  . Anxiety    on lexapro  . Heavy smoker (more than 20 cigarettes per day) 08/06/2011  . Vaginal Pap smear, abnormal    LGSIL with HPV 2019 -colpo done   No past surgical history on file. Current Outpatient Medications on File Prior to Visit  Medication Sig Dispense Refill  . betamethasone dipropionate 0.05 % cream Apply topically 2 (two) times daily.    . clonazePAM (KLONOPIN) 0.5 MG tablet Take 0.5 tablets (0.25 mg total) by mouth 2 (two) times daily as needed for anxiety. 30 tablet 0  . escitalopram (LEXAPRO) 20 MG tablet Take 1 tablet by mouth once daily (Patient taking differently: 10 mg.) 90 tablet 0  . trimethoprim-polymyxin b (POLYTRIM) ophthalmic solution Place 2 drops into the left eye every 4 (four) hours. 10 mL 0   No current facility-administered medications on file prior to visit.   Allergies  Allergen Reactions  . Latex   . Augmentin [Amoxicillin-Pot Clavulanate]     GI Upset  . Codeine    Social History   Socioeconomic History  . Marital status: Single    Spouse name: Not on file  . Number of children: Not on file  . Years of education: Not on file  . Highest education level: Not on file  Occupational History  . Not on file  Tobacco Use  . Smoking status: Current Every Day Smoker     Packs/day: 1.00    Years: 15.00    Pack years: 15.00    Types: Cigarettes  . Smokeless tobacco: Never Used  . Tobacco comment: trying to cut back and quit  Substance and Sexual Activity  . Alcohol use: Yes    Comment: rarely  . Drug use: No  . Sexual activity: Yes    Birth control/protection: None  Other Topics Concern  . Not on file  Social History Narrative  . Not on file   Social Determinants of Health   Financial Resource Strain: Not on file  Food Insecurity: Not on file  Transportation Needs: Not on file  Physical Activity: Not on file  Stress: Not on file  Social Connections: Not on file  Intimate Partner Violence: Not on file      Review of Systems  All other systems reviewed and are negative.      Objective:   Physical Exam Constitutional:      General: She is not in acute distress.    Appearance: She is well-developed. She is not diaphoretic.  HENT:     Right Ear: External ear normal.     Left Ear: External ear normal.     Nose: Nose normal.     Mouth/Throat:     Pharynx: No oropharyngeal exudate.  Eyes:  Conjunctiva/sclera: Conjunctivae normal.  Cardiovascular:     Rate and Rhythm: Normal rate and regular rhythm.     Heart sounds: Normal heart sounds. No murmur heard. No friction rub. No gallop.   Pulmonary:     Effort: Pulmonary effort is normal. No respiratory distress.     Breath sounds: Normal breath sounds. No wheezing or rales.  Abdominal:     General: Bowel sounds are normal.     Palpations: Abdomen is soft.  Musculoskeletal:     Cervical back: Neck supple.  Lymphadenopathy:     Cervical: No cervical adenopathy.           Assessment & Plan:  Acute viral syndrome  I believe the patient is likely contracted some type of anterovirus and is developing viral gastroenteritis.  I recommended rest, I recommended that she push Gatorade.  Take ibuprofen 800 mg every 8 hours as needed for headache or body aches, use Zofran 4 mg every 8  hours as needed for nausea.  She can use Imodium as needed for diarrhea.  Anticipate symptoms to gradually improve over the next 3 to 4 days.  Reassess immediately if symptoms worsen or change in any way.

## 2020-11-05 ENCOUNTER — Other Ambulatory Visit: Payer: Self-pay

## 2020-11-05 ENCOUNTER — Ambulatory Visit: Payer: 59 | Admitting: Nurse Practitioner

## 2020-11-05 ENCOUNTER — Encounter: Payer: Self-pay | Admitting: Family Medicine

## 2020-11-05 ENCOUNTER — Encounter: Payer: Self-pay | Admitting: Nurse Practitioner

## 2020-11-05 VITALS — BP 102/60 | HR 90 | Temp 98.1°F | Wt 122.0 lb

## 2020-11-05 DIAGNOSIS — G933 Postviral fatigue syndrome: Secondary | ICD-10-CM

## 2020-11-05 DIAGNOSIS — J301 Allergic rhinitis due to pollen: Secondary | ICD-10-CM

## 2020-11-05 DIAGNOSIS — G9331 Postviral fatigue syndrome: Secondary | ICD-10-CM

## 2020-11-05 LAB — URINALYSIS, ROUTINE W REFLEX MICROSCOPIC
Bacteria, UA: NONE SEEN /HPF
Bilirubin Urine: NEGATIVE
Glucose, UA: NEGATIVE
Hyaline Cast: NONE SEEN /LPF
Ketones, ur: NEGATIVE
Leukocytes,Ua: NEGATIVE
Nitrite: NEGATIVE
Protein, ur: NEGATIVE
Specific Gravity, Urine: 1.005 (ref 1.001–1.03)
WBC, UA: NONE SEEN /HPF (ref 0–5)
pH: 5.5 (ref 5.0–8.0)

## 2020-11-05 LAB — MICROSCOPIC MESSAGE

## 2020-11-05 NOTE — Progress Notes (Signed)
Subjective:    Patient ID: Angela [name], female    DOB: 1976/05/28, 45 y.o.   MRN: [id number]  HPI: Angela [name] is a 45 y.o. female presenting for not feeling well.  Chief Complaint  Patient presents with  . Illness    Saw [facility] 2 days ago, she is wanting to have labs done to make sure she is fine. Since covid in Jan, she is not feeling the same   GASTROENTERITIS  Reports the nausea got better but came back this morning.  Woke up with headache. Feels restless, "out of it."  Had ear infection a few weeks ago - treated with amoxicillin and azithromycin.  Started menses Sunday.  Notices a lot of drainage in her throat when she wakes up.  She is tolerating solid and liquid foods with some nausea but no vomiting.  Duration: days Diarrhea: yes non-bloody  Episodes of diarrhea/day: 3-4 times, 2 is normal for her Description of diarrhea: brown to light brown Nausea: yes Vomiting: no Episodes of vomit/day: 0 Description of vomiting: none Abdominal pain: yes Fever: no Decreased appetite: yes; improved yesterday evening Tolerating liquids: yes  Recent food out: pizza Sunday and ate at friend's house - pork chops and vegetable Foreign travel: no Relevant dietary history: no Similar illness in contacts: no Recent antibiotic use: yes; amoxicillin and zpack  Status: better Treatments attempted: zofran  Allergies  Allergen Reactions  . Latex   . Augmentin [Amoxicillin-Pot Clavulanate]     GI Upset  . Codeine     Outpatient Encounter Medications as of 11/05/2020  Medication Sig  . betamethasone dipropionate 0.05 % cream Apply topically 2 (two) times daily.  . clonazePAM (KLONOPIN) 0.5 MG tablet Take 0.5 tablets (0.25 mg total) by mouth 2 (two) times daily as needed for anxiety.  Marland Kitchen escitalopram (LEXAPRO) 20 MG tablet Take 1 tablet by mouth once daily (Patient taking differently: 10 mg.)  . ondansetron (ZOFRAN) 4 MG tablet Take 1 tablet (4 mg total)  by mouth every 8 (eight) hours as needed for nausea or vomiting.  . trimethoprim-polymyxin b (POLYTRIM) ophthalmic solution Place 2 drops into the left eye every 4 (four) hours.   No facility-administered encounter medications on file as of 11/05/2020.    Patient Active Problem List   Diagnosis Date Noted  . Abnormal Pap smear of cervix 03/26/2018  . Anxiety   . Heavy smoker (more than 20 cigarettes per day) 08/06/2011    Past Medical History:  Diagnosis Date  . Allergy    latex and codeine  . Anxiety    on lexapro  . Heavy smoker (more than 20 cigarettes per day) 08/06/2011  . Upper respiratory tract infection 08/27/2020  . Vaginal Pap smear, abnormal    LGSIL with HPV 2019 -colpo done    Relevant past medical, surgical, family and social history reviewed and updated as indicated. Interim medical history since our last visit reviewed.  Review of Systems Per HPI unless specifically indicated above     Objective:    BP 102/60   Pulse 90   Temp 98.1 F (36.7 C)   Wt 122 lb (55.3 kg)   LMP 11/02/2020   SpO2 100%   BMI 22.31 kg/m   Wt Readings from Last 3 Encounters:  11/05/20 122 lb (55.3 kg)  11/03/20 121 lb 12.8 oz (55.2 kg)  10/16/20 123 lb (55.8 kg)    Physical Exam Vitals and nursing note reviewed.  Constitutional:      General:  She is not in acute distress.    Appearance: Normal appearance. She is not toxic-appearing.  HENT:     Head: Normocephalic and atraumatic.     Right Ear: Tympanic membrane, ear canal and external ear normal.     Left Ear: Tympanic membrane, ear canal and external ear normal.     Nose: Nose normal. No congestion.     Mouth/Throat:     Mouth: Mucous membranes are moist.     Pharynx: Oropharynx is clear. Posterior oropharyngeal erythema present.  Eyes:     General: No scleral icterus.    Extraocular Movements: Extraocular movements intact.  Cardiovascular:     Rate and Rhythm: Normal rate and regular rhythm.     Heart sounds:  Normal heart sounds. No murmur heard.   Pulmonary:     Effort: Pulmonary effort is normal. No respiratory distress.     Breath sounds: Normal breath sounds. No wheezing, rhonchi or rales.  Abdominal:     General: Abdomen is flat. Bowel sounds are normal. There is no distension.     Palpations: Abdomen is soft. There is no mass.     Tenderness: There is no abdominal tenderness.  Musculoskeletal:        General: Normal range of motion.     Right lower leg: No edema.     Left lower leg: No edema.  Skin:    General: Skin is warm and dry.     Capillary Refill: Capillary refill takes less than 2 seconds.     Coloration: Skin is not jaundiced or pale.     Findings: No erythema.  Neurological:     Mental Status: She is alert and oriented to person, place, and time.     Motor: No weakness.     Gait: Gait normal.  Psychiatric:        Mood and Affect: Mood normal.        Behavior: Behavior normal.        Thought Content: Thought content normal.        Judgment: Judgment normal.    Results for orders placed or performed in visit on 11/05/20  Urinalysis, Routine w reflex microscopic  Result Value Ref Range   Color, Urine LIGHT YELLOW YELLOW   APPearance SLIGHTLY CLOUDY (A) CLEAR   Specific Gravity, Urine 1.005 1.001 - 1.03   pH 5.5 5.0 - 8.0   Glucose, UA NEGATIVE NEGATIVE   Bilirubin Urine NEGATIVE NEGATIVE   Ketones, ur NEGATIVE NEGATIVE   Hgb urine dipstick 2+ (A) NEGATIVE   Protein, ur NEGATIVE NEGATIVE   Nitrite NEGATIVE NEGATIVE   Leukocytes,Ua NEGATIVE NEGATIVE   WBC, UA NONE SEEN 0 - 5 /HPF   RBC / HPF 0-2 0 - 2 /HPF   Squamous Epithelial / LPF 0-5 < OR = 5 /HPF   Bacteria, UA NONE SEEN NONE SEEN /HPF   Hyaline Cast NONE SEEN NONE SEEN /LPF  Microscopic Message  Result Value Ref Range   Note        Assessment & Plan:  1. Postviral fatigue syndrome Acute, since having COVID-19 infection in January 2022.  Reports GI symptoms that started over the weekend have  improved.  Will check labs today including Vitamin D, lipids, CMP to check kidney and liver function, TSH, CBC, and urinalysis.  If labs negative, likely patient is dealing with long-haul COVID-19 symptoms.  Reassurance given.  Follow up as needed.  - VITAMIN D 25 Hydroxy (Vit-D Deficiency, Fractures) - Lipid Panel -  COMPLETE METABOLIC PANEL WITH GFR - TSH - CBC with Differential - Urinalysis, Routine w reflex microscopic  2. Seasonal allergic rhinitis due to pollen Acute.  Encouraged patient to resume OTC treatment for post nasal drip/allergic rhinitis symptoms - daily antihistamine, nasal corticosteroid, and saline nasal spray.  Follow up with no improvement.   Follow up plan: Return if symptoms worsen or fail to improve.

## 2020-11-05 NOTE — Patient Instructions (Signed)
F/u if symptoms persist

## 2020-11-06 LAB — COMPLETE METABOLIC PANEL WITH GFR
AG Ratio: 2 (calc) (ref 1.0–2.5)
ALT: 11 U/L (ref 6–29)
AST: 15 U/L (ref 10–30)
Albumin: 4.5 g/dL (ref 3.6–5.1)
Alkaline phosphatase (APISO): 60 U/L (ref 31–125)
BUN: 13 mg/dL (ref 7–25)
CO2: 24 mmol/L (ref 20–32)
Calcium: 9.5 mg/dL (ref 8.6–10.2)
Chloride: 108 mmol/L (ref 98–110)
Creat: 0.64 mg/dL (ref 0.50–1.10)
GFR, Est African American: 127 mL/min/{1.73_m2} (ref >=60)
GFR, Est Non African American: 109 mL/min/{1.73_m2} (ref >=60)
Globulin: 2.3 g/dL (calc) (ref 1.9–3.7)
Glucose, Bld: 95 mg/dL (ref 65–99)
Potassium: 4.1 mmol/L (ref 3.5–5.3)
Sodium: 139 mmol/L (ref 135–146)
Total Bilirubin: 0.6 mg/dL (ref 0.2–1.2)
Total Protein: 6.8 g/dL (ref 6.1–8.1)

## 2020-11-06 LAB — CBC WITH DIFFERENTIAL/PLATELET
Absolute Monocytes: 419 cells/uL (ref 200–950)
Basophils Absolute: 42 cells/uL (ref 0–200)
Basophils Relative: 0.8 %
Eosinophils Absolute: 48 cells/uL (ref 15–500)
Eosinophils Relative: 0.9 %
HCT: 42.9 % (ref 35.0–45.0)
Hemoglobin: 14.2 g/dL (ref 11.7–15.5)
Lymphs Abs: 954 cells/uL (ref 850–3900)
MCH: 30.9 pg (ref 27.0–33.0)
MCHC: 33.1 g/dL (ref 32.0–36.0)
MCV: 93.5 fL (ref 80.0–100.0)
MPV: 10.3 fL (ref 7.5–12.5)
Monocytes Relative: 7.9 %
Neutro Abs: 3837 cells/uL (ref 1500–7800)
Neutrophils Relative %: 72.4 %
Platelets: 285 10*3/uL (ref 140–400)
RBC: 4.59 10*6/uL (ref 3.80–5.10)
RDW: 12.8 % (ref 11.0–15.0)
Total Lymphocyte: 18 %
WBC: 5.3 10*3/uL (ref 3.8–10.8)

## 2020-11-06 LAB — TSH: TSH: 0.66 mIU/L

## 2020-11-06 LAB — LIPID PANEL
Cholesterol: 201 mg/dL — ABNORMAL HIGH (ref <200)
HDL: 61 mg/dL (ref >=50)
LDL Cholesterol (Calc): 122 mg/dL (calc) — ABNORMAL HIGH
Non-HDL Cholesterol (Calc): 140 mg/dL (calc) — ABNORMAL HIGH (ref <130)
Total CHOL/HDL Ratio: 3.3 (calc) (ref <5.0)
Triglycerides: 82 mg/dL (ref <150)

## 2020-11-06 LAB — VITAMIN D 25 HYDROXY (VIT D DEFICIENCY, FRACTURES): Vit D, 25-Hydroxy: 18 ng/mL — ABNORMAL LOW (ref 30–100)

## 2020-11-07 ENCOUNTER — Encounter: Payer: Self-pay | Admitting: Emergency Medicine

## 2020-11-07 ENCOUNTER — Other Ambulatory Visit: Payer: Self-pay

## 2020-11-07 ENCOUNTER — Ambulatory Visit
Admission: EM | Admit: 2020-11-07 | Discharge: 2020-11-07 | Disposition: A | Discharge status: Home / Self Care | Payer: 59 | Attending: Emergency Medicine | Admitting: Emergency Medicine

## 2020-11-07 ENCOUNTER — Telehealth: Payer: Self-pay

## 2020-11-07 ENCOUNTER — Other Ambulatory Visit: Payer: Self-pay | Admitting: Family Medicine

## 2020-11-07 DIAGNOSIS — F419 Anxiety disorder, unspecified: Secondary | ICD-10-CM

## 2020-11-07 DIAGNOSIS — R519 Headache, unspecified: Secondary | ICD-10-CM

## 2020-11-07 MED ORDER — TIZANIDINE HCL 4 MG PO TABS: 4.0000 mg | ORAL_TABLET | Freq: Four times a day (QID) | ORAL | 0 refills | Status: DC | PRN

## 2020-11-07 MED ORDER — NAPROXEN 500 MG PO TABS: 500.0000 mg | ORAL_TABLET | Freq: Two times a day (BID) | ORAL | 0 refills | Status: DC

## 2020-11-07 NOTE — Discharge Instructions (Addendum)
Naprosyn twice daily with food, take separately from Lexapro Supplement tizanidine which is a muscle relaxer at home/bedtime-this is a muscle relaxer, will cause drowsiness, do not drive or work after taking Continue Lexapro/Klonopin as prescribed Follow-up with primary care if anxiety persisting Please return if headache not improving or worsening

## 2020-11-07 NOTE — ED Triage Notes (Signed)
Pt presents today with c/o of headache x1 wk, +nausea. She also reports having Covid in January and continues to have anxiety.

## 2020-11-07 NOTE — Telephone Encounter (Signed)
Pt called stating she had been having anxiety attacks and wants to know if her labs could be reviewed to see if it is hormonal. Pt made aware that she will need to see her PCP first and if he recommends that she sees her OBGYN, we can get her scheduled. Understanding was voiced. Angela [name], CMA

## 2020-11-07 NOTE — ED Provider Notes (Signed)
Angela [name]    CSN: [id number] Arrival date & time: 11/07/20  1012      History   Chief Complaint Chief Complaint  Patient presents with  . Headache    HPI Angela [name] is a 45 y.o. female presenting today for evaluation of a headache.  Reports that she has had a headache for approximately 1 week.  Has had associated nausea. Menstrual cycle began Sunday. Initially nauseas on Monday at work. Felt slightly anxious. Using tylenol for headache with only temporary relief.  Has felt fatigued. Cut back on caffeine. Increased lexapro to 20 mg recently. Dry mouth. Headache in neck/shoulders, will radiate up, bitemporal. Mild dizziness. Morning anxiety. Had COVID Jan 2022, feels anxiety flared since.     HPI  Past Medical History:  Diagnosis Date  . Allergy    latex and codeine  . Anxiety    on lexapro  . Heavy smoker (more than 20 cigarettes per day) 08/06/2011  . Upper respiratory tract infection 08/27/2020  . Vaginal Pap smear, abnormal    LGSIL with HPV 2019 -colpo done    Patient Active Problem List   Diagnosis Date Noted  . Abnormal Pap smear of cervix 03/26/2018  . Anxiety   . Heavy smoker (more than 20 cigarettes per day) 08/06/2011    History reviewed. No pertinent surgical history.  OB History    Gravida  0   Para  0   Term  0   Preterm  0   AB  0   Living  0     SAB  0   IAB  0   Ectopic  0   Multiple  0   Live Births               Home Medications    Prior to Admission medications   Medication Sig Start Date End Date Taking? Authorizing Provider  escitalopram (LEXAPRO) 20 MG tablet Take 1 tablet by mouth once daily Patient taking differently: 10 mg. 06/09/20  Yes Donita Brooks, MD  ondansetron (ZOFRAN) 4 MG tablet Take 1 tablet (4 mg total) by mouth every 8 (eight) hours as needed for nausea or vomiting. 11/03/20  Yes Donita Brooks, MD  betamethasone dipropionate 0.05 % cream Apply topically 2 (two) times  daily.    [provider]  clonazePAM (KLONOPIN) 0.5 MG tablet Take 0.5 tablets (0.25 mg total) by mouth 2 (two) times daily as needed for anxiety. 10/16/20   Donita Brooks, MD  naproxen (NAPROSYN) 500 MG tablet Take 1 tablet (500 mg total) by mouth 2 (two) times daily. 11/07/20   Desirre Eickhoff C, PA-C  tiZANidine (ZANAFLEX) 4 MG tablet Take 1 tablet (4 mg total) by mouth every 6 (six) hours as needed for muscle spasms. 11/07/20   Zachary Nole C, PA-C  trimethoprim-polymyxin b (POLYTRIM) ophthalmic solution Place 2 drops into the left eye every 4 (four) hours. 09/04/20   Donita Brooks, MD    Family History Family History  Problem Relation Age of Onset  . Cancer Maternal Grandmother        throat  . Cancer Maternal Grandfather        prostate  . Breast cancer Mother 59    Social History Social History   Tobacco Use  . Smoking status: Current Every Day Smoker    Packs/day: 1.00    Years: 15.00    Pack years: 15.00    Types: Cigarettes  . Smokeless tobacco: Never Used  .  Tobacco comment: trying to cut back and quit  Substance Use Topics  . Alcohol use: Yes    Comment: rarely  . Drug use: No     Allergies   Latex, Augmentin [amoxicillin-pot clavulanate], and Codeine   Review of Systems Review of Systems  Constitutional: Negative for fatigue and fever.  HENT: Negative for congestion, sinus pressure and sore throat.   Eyes: Negative for photophobia, pain and visual disturbance.  Respiratory: Negative for cough and shortness of breath.   Cardiovascular: Negative for chest pain.  Gastrointestinal: Positive for nausea. Negative for abdominal pain and vomiting.  Genitourinary: Negative for decreased urine volume and hematuria.  Musculoskeletal: Negative for myalgias, neck pain and neck stiffness.  Neurological: Positive for headaches. Negative for dizziness, syncope, facial asymmetry, speech difficulty, weakness, light-headedness and numbness.   Psychiatric/Behavioral: The patient is nervous/anxious.      Physical Exam Triage Vital Signs ED Triage Vitals  Enc Vitals Group     BP 11/07/20 1025 114/79     Pulse Rate 11/07/20 1025 92     Resp 11/07/20 1025 18     Temp 11/07/20 1025 97.9 F (36.6 C)     Temp Source 11/07/20 1025 Oral     SpO2 11/07/20 1025 (!) 1 %     Weight --      Height --      Head Circumference --      Peak Flow --      Pain Score 11/07/20 1022 7     Pain Loc --      Pain Edu? --      Excl. in GC? --    No data found.  Updated Vital Signs BP 114/79 (BP Location: Right Arm)   Pulse 92   Temp 97.9 F (36.6 C) (Oral)   Resp 18   LMP 11/02/2020   SpO2 100%   Visual Acuity Right Eye Distance:   Left Eye Distance:   Bilateral Distance:    Right Eye Near:   Left Eye Near:    Bilateral Near:     Physical Exam Vitals and nursing note reviewed.  Constitutional:      Appearance: She is well-developed.     Comments: No acute distress  HENT:     Head: Normocephalic and atraumatic.     Nose: Nose normal.  Eyes:     Extraocular Movements: Extraocular movements intact.     Conjunctiva/sclera: Conjunctivae normal.     Pupils: Pupils are equal, round, and reactive to light.  Cardiovascular:     Rate and Rhythm: Normal rate and regular rhythm.  Pulmonary:     Effort: Pulmonary effort is normal. No respiratory distress.     Comments: Breathing comfortably at rest, CTABL, no wheezing, rales or other adventitious sounds auscultated  Abdominal:     General: There is no distension.  Musculoskeletal:        General: Normal range of motion.     Cervical back: Neck supple.  Skin:    General: Skin is warm and dry.  Neurological:     General: No focal deficit present.     Mental Status: She is alert and oriented to person, place, and time. Mental status is at baseline.     Cranial Nerves: No cranial nerve deficit.     Motor: No weakness.     Gait: Gait normal.      UC Treatments / Results   Labs (all labs ordered are listed, but only abnormal results are displayed) Labs  Reviewed - No data to display  EKG   Radiology No results found.  Procedures Procedures (including critical [name] time)  Medications Ordered in UC Medications - No data to display  Initial Impression / Assessment and Plan / UC Course  I have reviewed the triage vital signs and the nursing notes.  Pertinent labs & imaging results that were available during my [name] of the patient were reviewed by me and considered in my medical decision making (see chart for details).     Headache-treating with Naprosyn and muscle relaxers, offered migraine cocktail, but opted to defer.  No red flags.  Headache seems most likely tension/stress headache with occasional migraine symptoms.  Nausea-use Zofran as needed which she already has prescribed  Anxiety-continue Lexapro and Klonopin, follow-up with PCP for further evaluation of anxiety persisting  Discussed strict return precautions. Patient verbalized understanding and is agreeable with plan.  Final Clinical Impressions(s) / UC Diagnoses   Final diagnoses:  Acute nonintractable headache, unspecified headache type     Discharge Instructions     Naprosyn twice daily with food, take separately from Lexapro Supplement tizanidine which is a muscle relaxer at home/bedtime-this is a muscle relaxer, will cause drowsiness, do not drive or work after taking Continue Lexapro/Klonopin as prescribed Follow-up with primary [name] if anxiety persisting Please return if headache not improving or worsening    ED Prescriptions    Medication Sig Dispense Auth. Provider   naproxen (NAPROSYN) 500 MG tablet  (Status: Discontinued) Take 1 tablet (500 mg total) by mouth 2 (two) times daily. 30 tablet Mathias Bogacki C, PA-C   tiZANidine (ZANAFLEX) 4 MG tablet  (Status: Discontinued) Take 1 tablet (4 mg total) by mouth every 6 (six) hours as needed for muscle spasms. 30 tablet  Jalah Warmuth C, PA-C   naproxen (NAPROSYN) 500 MG tablet Take 1 tablet (500 mg total) by mouth 2 (two) times daily. 30 tablet Antolin Belsito C, PA-C   tiZANidine (ZANAFLEX) 4 MG tablet Take 1 tablet (4 mg total) by mouth every 6 (six) hours as needed for muscle spasms. 30 tablet Jodi Criscuolo, Marianna C, PA-C     PDMP not reviewed this encounter.   Lew Dawes, PA-C 11/07/20 1222

## 2020-11-10 ENCOUNTER — Other Ambulatory Visit: Payer: Self-pay

## 2020-11-10 ENCOUNTER — Telehealth: Payer: Self-pay

## 2020-11-10 DIAGNOSIS — E559 Vitamin D deficiency, unspecified: Secondary | ICD-10-CM

## 2020-11-10 DIAGNOSIS — F419 Anxiety disorder, unspecified: Secondary | ICD-10-CM

## 2020-11-10 MED ORDER — VITAMIN D (CHOLECALCIFEROL) 25 MCG (1000 UT) PO CAPS: ORAL_CAPSULE | ORAL | 1 refills | Status: AC

## 2020-11-10 MED ORDER — ESCITALOPRAM OXALATE 20 MG PO TABS: 20.0000 mg | ORAL_TABLET | Freq: Every day | ORAL | 0 refills | Status: DC

## 2020-11-10 NOTE — Telephone Encounter (Signed)
Okay to send in cholecalciferol (Vitamin D3) 25 mcg (1000 IU) capsule #90 with 1 refill, this is also over-the-counter.  Lexapro is at max dose already, would recommend scheduling with Dr. Tanya Nones for management of anxiety

## 2020-11-10 NOTE — Telephone Encounter (Signed)
Pt has been scheduled with pcp 3/23

## 2020-11-12 ENCOUNTER — Telehealth (INDEPENDENT_AMBULATORY_CARE_PROVIDER_SITE_OTHER): Payer: 59 | Admitting: Family Medicine

## 2020-11-12 ENCOUNTER — Encounter: Payer: Self-pay | Admitting: Family Medicine

## 2020-11-12 ENCOUNTER — Other Ambulatory Visit: Payer: Self-pay

## 2020-11-12 DIAGNOSIS — F411 Generalized anxiety disorder: Secondary | ICD-10-CM | POA: Diagnosis not present

## 2020-11-12 MED ORDER — VENLAFAXINE HCL ER 75 MG PO CP24: 150.0000 mg | ORAL_CAPSULE | Freq: Every day | ORAL | 1 refills | Status: DC

## 2020-11-12 NOTE — Progress Notes (Signed)
Subjective:    Patient ID: Angela [name], female    DOB: Oct 29, 1975, 45 y.o.   MRN: [id number]  HPI  Patient is being seen today as a telephone visit.  Phone call began at 231.  Phone call concluded at 256.  She continues to complain of headaches, neck stiffness, fatigue, nausea.  She has poor appetite.  However she admits that she is under tremendous stress and anxiety.  She is afraid to go to work.  She is afraid to be by herself.  She is staying with her mother and her sister.  She has an impending sense of doom.  She feels like something is wrong but she cannot put into words exactly how she feels.  She always feels anxious.  She feels like there is a rush of adrenaline that races through her body as soon as she is awake and last all day long.  She is afraid to take the Klonopin, afraid that it will make her "drunk".  She has been on Lexapro 20 mg a day in some form for many many years and she believes that the medication may be ineffective.  She is having daily panic attacks.  She denies any suicidal ideation or manic symptoms or racing thoughts or insomnia Past Medical History:  Diagnosis Date  . Allergy    latex and codeine  . Anxiety    on lexapro  . Heavy smoker (more than 20 cigarettes per day) 08/06/2011  . Upper respiratory tract infection 08/27/2020  . Vaginal Pap smear, abnormal    LGSIL with HPV 2019 -colpo done   No past surgical history on file. Current Outpatient Medications on File Prior to Visit  Medication Sig Dispense Refill  . betamethasone dipropionate 0.05 % cream Apply topically 2 (two) times daily.    . clonazePAM (KLONOPIN) 0.5 MG tablet Take 0.5 tablets (0.25 mg total) by mouth 2 (two) times daily as needed for anxiety. 30 tablet 0  . escitalopram (LEXAPRO) 20 MG tablet Take 1 tablet by mouth once daily 90 tablet 0  . naproxen (NAPROSYN) 500 MG tablet Take 1 tablet (500 mg total) by mouth 2 (two) times daily. 30 tablet 0  . ondansetron (ZOFRAN) 4 MG  tablet Take 1 tablet (4 mg total) by mouth every 8 (eight) hours as needed for nausea or vomiting. 20 tablet 0  . tiZANidine (ZANAFLEX) 4 MG tablet Take 1 tablet (4 mg total) by mouth every 6 (six) hours as needed for muscle spasms. 30 tablet 0  . trimethoprim-polymyxin b (POLYTRIM) ophthalmic solution Place 2 drops into the left eye every 4 (four) hours. 10 mL 0  . Vitamin D, Cholecalciferol, 25 MCG (1000 UT) CAPS Take 1 capsule by mouth daily 90 capsule 1   No current facility-administered medications on file prior to visit.   Allergies  Allergen Reactions  . Latex   . Augmentin [Amoxicillin-Pot Clavulanate]     GI Upset  . Codeine    Social History   Socioeconomic History  . Marital status: Single    Spouse name: Not on file  . Number of children: Not on file  . Years of education: Not on file  . Highest education level: Not on file  Occupational History  . Not on file  Tobacco Use  . Smoking status: Current Every Day Smoker    Packs/day: 1.00    Years: 15.00    Pack years: 15.00    Types: Cigarettes  . Smokeless tobacco: Never Used  .  Tobacco comment: trying to cut back and quit  Substance and Sexual Activity  . Alcohol use: Yes    Comment: rarely  . Drug use: No  . Sexual activity: Yes    Birth control/protection: None  Other Topics Concern  . Not on file  Social History Narrative  . Not on file   Social Determinants of Health   Financial Resource Strain: Not on file  Food Insecurity: Not on file  Transportation Needs: Not on file  Physical Activity: Not on file  Stress: Not on file  Social Connections: Not on file  Intimate Partner Violence: Not on file     Review of Systems  All other systems reviewed and are negative.      Objective:   Physical Exam        Assessment & Plan:  GAD (generalized anxiety disorder) - Plan: venlafaxine XR (EFFEXOR XR) 75 MG 24 hr capsule  I believe the patient is dealing with generalized anxiety disorder and  panic disorder.  I recommended that she start Klonopin 0.25 mg twice daily on a scheduled basis.  Increase to the point 5 mg twice daily if necessary we have got to get some control of her anxiety in order for her to function.  I have recommended stopping Lexapro and switching to venlafaxine extended release 75 mg p.o. every morning.  Increase to 150 mg p.o. every morning in 1 week.  Hopefully as the venlafaxine takes effect, we can wean the patient away from the Klonopin.  I recommended that she stay out of work through the 31st until the medicines have not had a chance to in her system and to see how she responds to them.  Reassess next week.

## 2020-11-20 ENCOUNTER — Telehealth (INDEPENDENT_AMBULATORY_CARE_PROVIDER_SITE_OTHER): Payer: 59 | Admitting: Family Medicine

## 2020-11-20 ENCOUNTER — Other Ambulatory Visit: Payer: Self-pay

## 2020-11-20 ENCOUNTER — Encounter: Payer: Self-pay | Admitting: Family Medicine

## 2020-11-20 DIAGNOSIS — F411 Generalized anxiety disorder: Secondary | ICD-10-CM | POA: Diagnosis not present

## 2020-11-20 NOTE — Progress Notes (Signed)
Subjective:    Patient ID: Angela [name], female    DOB: 08-04-1976, 45 y.o.   MRN: [id number]  HPI 11/12/20 Patient is being seen today as a telephone visit.  Phone call began at 231.  Phone call concluded at 256.  She continues to complain of headaches, neck stiffness, fatigue, nausea.  She has poor appetite.  However she admits that she is under tremendous stress and anxiety.  She is afraid to go to work.  She is afraid to be by herself.  She is staying with her mother and her sister.  She has an impending sense of doom.  She feels like something is wrong but she cannot put into words exactly how she feels.  She always feels anxious.  She feels like there is a rush of adrenaline that races through her body as soon as she is awake and last all day long.  She is afraid to take the Klonopin, afraid that it will make her "drunk".  She has been on Lexapro 20 mg a day in some form for many many years and she believes that the medication may be ineffective.  She is having daily panic attacks.  She denies any suicidal ideation or manic symptoms or racing thoughts or insomnia.  At that time, my plan was: I believe the patient is dealing with generalized anxiety disorder and panic disorder.  I recommended that she start Klonopin 0.25 mg twice daily on a scheduled basis.  Increase to 0.5 mg twice daily if necessary.  We have got to get some control of her anxiety in order for her to function.  I have recommended stopping Lexapro and switching to venlafaxine extended release 75 mg p.o. every morning.  Increase to 150 mg p.o. every morning in 1 week.  Hopefully as the venlafaxine takes effect, we can wean the patient away from the Klonopin.  I recommended that she stay out of work through the 31st until the medicines have not had a chance to in her system and to see how she responds to them.  Reassess next week.  11/20/20 Patient is being seen today as a telephone visit.  Phone call began at 1226.  Phone  call concluded at 1244.  Patient consents to be seen via telephone.  She is currently at home.  I am currently at my office.  I had previously written the patient out of work until tomorrow.  She has increased venlafaxine to 150 mg a day this morning.  She has not had any side effects from the medication other than a little bit of sleepiness.  She is still hesitant when to go back to work tomorrow.  She is afraid that she is going to have more side effects from the medication.  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  They are the busiest at the end of the month and the first day of the month due to payroll.  She is afraid of going back in the middle chaos.  She is asking if I can extend the time off until Tuesday or Wednesday just to let the workweek settle down before she goes back.  I believe that is a reasonable request.  She denies any depression or anhedonia.  She has not taken the Klonopin as I instructed.  She is taking it sparingly.  She is taking a half of 0.25 mg when she does take it.  Therefore she is taking the pill and breaking it into quarters and taking a quarter  of the pill.  Even this lower the dose makes her feel sleepy making her afraid to take the medication but she does notice that her headaches nausea and other somatic symptoms improve when she takes it.  Therefore I do feel that most of this is uncontrolled anxiety. Past Medical History:  Diagnosis Date  . Allergy    latex and codeine  . Anxiety    on lexapro  . Heavy smoker (more than 20 cigarettes per day) 08/06/2011  . Upper respiratory tract infection 08/27/2020  . Vaginal Pap smear, abnormal    LGSIL with HPV 2019 -colpo done   No past surgical history on file. Current Outpatient Medications on File Prior to Visit  Medication Sig Dispense Refill  . betamethasone dipropionate 0.05 % cream Apply topically 2 (two) times daily.    . clonazePAM (KLONOPIN) 0.5 MG tablet Take 0.5 tablets (0.25 mg total) by mouth 2 (two) times daily as  needed for anxiety. 30 tablet 0  . escitalopram (LEXAPRO) 20 MG tablet Take 1 tablet by mouth once daily 90 tablet 0  . naproxen (NAPROSYN) 500 MG tablet Take 1 tablet (500 mg total) by mouth 2 (two) times daily. 30 tablet 0  . ondansetron (ZOFRAN) 4 MG tablet Take 1 tablet (4 mg total) by mouth every 8 (eight) hours as needed for nausea or vomiting. 20 tablet 0  . tiZANidine (ZANAFLEX) 4 MG tablet Take 1 tablet (4 mg total) by mouth every 6 (six) hours as needed for muscle spasms. 30 tablet 0  . trimethoprim-polymyxin b (POLYTRIM) ophthalmic solution Place 2 drops into the left eye every 4 (four) hours. 10 mL 0  . venlafaxine XR (EFFEXOR XR) 75 MG 24 hr capsule Take 2 capsules (150 mg total) by mouth daily with breakfast. Stop lexapro 60 capsule 1  . Vitamin D, Cholecalciferol, 25 MCG (1000 UT) CAPS Take 1 capsule by mouth daily 90 capsule 1   No current facility-administered medications on file prior to visit.   Allergies  Allergen Reactions  . Latex   . Augmentin [Amoxicillin-Pot Clavulanate]     GI Upset  . Codeine    Social History   Socioeconomic History  . Marital status: Single    Spouse name: Not on file  . Number of children: Not on file  . Years of education: Not on file  . Highest education level: Not on file  Occupational History  . Not on file  Tobacco Use  . Smoking status: Current Every Day Smoker    Packs/day: 1.00    Years: 15.00    Pack years: 15.00    Types: Cigarettes  . Smokeless tobacco: Never Used  . Tobacco comment: trying to cut back and quit  Substance and Sexual Activity  . Alcohol use: Yes    Comment: rarely  . Drug use: No  . Sexual activity: Yes    Birth control/protection: None  Other Topics Concern  . Not on file  Social History Narrative  . Not on file   Social Determinants of Health   Financial Resource Strain: Not on file  Food Insecurity: Not on file  Transportation Needs: Not on file  Physical Activity: Not on file  Stress:  Not on file  Social Connections: Not on file  Intimate Partner Violence: Not on file     Review of Systems  All other systems reviewed and are negative.      Objective:   Physical Exam        Assessment &  Plan:  GAD (generalized anxiety disorder)  Increase venlafaxine to 150 mg a day.  I will write the patient out of work until Wednesday of next week.  Continue to use the Klonopin as needed for anxiety.  Anticipate that the anxiety will gradually start to improve over the next 3 to 4 weeks hopefully as the venlafaxine takes full effect.

## 2020-11-21 ENCOUNTER — Telehealth: Payer: Self-pay | Admitting: Family Medicine

## 2020-11-21 NOTE — Telephone Encounter (Signed)
At patient's request, letter for work sent to her email address of [email or web address]. Left message on patient's voicemail to request call back to verify she received it. Awaiting reply.

## 2020-11-24 NOTE — Telephone Encounter (Signed)
Patient called back 4/1 to confirm her receipt of the email.

## 2020-12-09 ENCOUNTER — Telehealth: Payer: Self-pay | Admitting: *Deleted

## 2020-12-09 NOTE — Telephone Encounter (Signed)
Received fax requesting clarification on leave from work.   Requested letter from provider that specifies how anxiety symptoms impacted day to day function and specific examples of tasks/ activities that patient was unable to perform.   Fax placed on provider desk for review.

## 2020-12-10 ENCOUNTER — Encounter: Payer: Self-pay | Admitting: Family Medicine

## 2020-12-10 NOTE — Telephone Encounter (Signed)
Patient left voicemail 4/19 to follow up on paperwork. Returned patient's call to inform her paperwork placed on provider's desk yesterday. Also informed patient provider was out of the office yesterday and has a full patient load today but will get to it when he's able to.

## 2020-12-10 NOTE — Telephone Encounter (Signed)
Letter transcribed by provider.   Placed in fax folder.

## 2021-01-12 ENCOUNTER — Other Ambulatory Visit: Payer: Self-pay | Admitting: *Deleted

## 2021-01-12 MED ORDER — VENLAFAXINE HCL ER 150 MG PO CP24: 150.0000 mg | ORAL_CAPSULE | Freq: Every day | ORAL | 0 refills | Status: DC

## 2021-01-15 ENCOUNTER — Telehealth: Payer: Self-pay

## 2021-01-15 NOTE — Telephone Encounter (Signed)
Patient called to have labs faxed to [facility] Dr. Dicie Beam office

## 2021-01-20 ENCOUNTER — Telehealth: Payer: Self-pay | Admitting: Family Medicine

## 2021-01-20 NOTE — Telephone Encounter (Signed)
Patient left a voicemail today during lunch hour to give information about the bill she received. Patient stated the original amount of the bill was $184.83. Patient disputing current balance due to $50 payment today. Please advise at [phone].

## 2021-01-21 NOTE — Telephone Encounter (Signed)
I spoke with patient she has an outstanding balance of $80.00 and a new statement went out on 01/20/2021. Patient agrees with the $80 remaining balance. She thought it was something different that was quoted yesterday.

## 2021-01-26 ENCOUNTER — Telehealth: Payer: Self-pay | Admitting: Family Medicine

## 2021-01-26 NOTE — Telephone Encounter (Signed)
Noted  

## 2021-01-26 NOTE — Telephone Encounter (Signed)
Pt called stating she would like to get her lab results faxed to [facility] for Dr. Roderic Scarce.  Fax num:  [phone]

## 2021-01-27 ENCOUNTER — Telehealth: Payer: Self-pay

## 2021-01-27 NOTE — Telephone Encounter (Signed)
Sent fax to [facility] of pt labs as requested by pt

## 2021-04-13 ENCOUNTER — Other Ambulatory Visit: Payer: Self-pay | Admitting: Obstetrics & Gynecology

## 2021-04-13 DIAGNOSIS — Z1231 Encounter for screening mammogram for malignant neoplasm of breast: Secondary | ICD-10-CM

## 2021-04-16 ENCOUNTER — Other Ambulatory Visit: Payer: Self-pay | Admitting: Family Medicine

## 2021-05-07 ENCOUNTER — Ambulatory Visit
Admission: RE | Admit: 2021-05-07 | Discharge: 2021-05-07 | Disposition: A | Discharge status: Home / Self Care | Payer: 59 | Source: Ambulatory Visit | Attending: Obstetrics & Gynecology | Admitting: Obstetrics & Gynecology

## 2021-05-07 ENCOUNTER — Other Ambulatory Visit: Payer: Self-pay

## 2021-05-07 DIAGNOSIS — Z1231 Encounter for screening mammogram for malignant neoplasm of breast: Secondary | ICD-10-CM

## 2021-05-13 ENCOUNTER — Other Ambulatory Visit: Payer: Self-pay | Admitting: Obstetrics & Gynecology

## 2021-05-13 DIAGNOSIS — R928 Other abnormal and inconclusive findings on diagnostic imaging of breast: Secondary | ICD-10-CM

## 2021-05-28 ENCOUNTER — Ambulatory Visit
Admission: RE | Admit: 2021-05-28 | Discharge: 2021-05-28 | Disposition: A | Discharge status: Home / Self Care | Payer: 59 | Source: Ambulatory Visit | Attending: Obstetrics & Gynecology | Admitting: Obstetrics & Gynecology

## 2021-05-28 ENCOUNTER — Other Ambulatory Visit: Payer: Self-pay

## 2021-05-28 DIAGNOSIS — R928 Other abnormal and inconclusive findings on diagnostic imaging of breast: Secondary | ICD-10-CM

## 2021-06-02 ENCOUNTER — Other Ambulatory Visit: Payer: 59

## 2021-06-09 ENCOUNTER — Telehealth: Payer: Self-pay | Admitting: Family Medicine

## 2021-06-09 NOTE — Telephone Encounter (Signed)
Patient called to request recommendations for managing symptoms.  Sx: nasal congestion, pressure in head, sneezing, sore throat, yellow mucous. No fever. Sx since night of 10/16.   Patient has virtual appt with NP 10/19; seeking relief in the meantime.  Please advise at  [phone].

## 2021-06-10 ENCOUNTER — Ambulatory Visit (INDEPENDENT_AMBULATORY_CARE_PROVIDER_SITE_OTHER): Payer: 59 | Admitting: Nurse Practitioner

## 2021-06-10 ENCOUNTER — Encounter: Payer: Self-pay | Admitting: Nurse Practitioner

## 2021-06-10 ENCOUNTER — Other Ambulatory Visit: Payer: Self-pay

## 2021-06-10 DIAGNOSIS — J069 Acute upper respiratory infection, unspecified: Secondary | ICD-10-CM | POA: Diagnosis not present

## 2021-06-10 DIAGNOSIS — R062 Wheezing: Secondary | ICD-10-CM

## 2021-06-10 MED ORDER — GUAIFENESIN ER 600 MG PO TB12: 600.0000 mg | ORAL_TABLET | Freq: Two times a day (BID) | ORAL | 0 refills | Status: DC | PRN

## 2021-06-10 MED ORDER — ALBUTEROL SULFATE HFA 108 (90 BASE) MCG/ACT IN AERS: 2.0000 | INHALATION_SPRAY | Freq: Four times a day (QID) | RESPIRATORY_TRACT | 0 refills | Status: DC | PRN

## 2021-06-10 NOTE — Progress Notes (Signed)
Subjective:    Patient ID: Angela [name], female    DOB: 1976-02-18, 45 y.o.   MRN: [id number]  HPI: Angela [name] is a 45 y.o. female presenting virtually for nasal congestion.  Chief Complaint  Patient presents with   Nasal Congestion   UPPER RESPIRATORY TRACT INFECTION Onset: Sunday afternoon COVID-19 testing history: at home covid test negative Monday COVID-19 vaccination status: not vaccinated Fever: no Cough: yes; productive Shortness of breath: no Wheezing: yes Chest pain: no Chest tightness: yes Chest congestion: yes Nasal congestion: yes Runny nose: yes Post nasal drip: yes Sneezing: no Sore throat: yes Swollen glands: yes Sinus pressure: yes; everywhere Headache: yes Face pain: no Toothache: no Ear pain: yes  Ear pressure: yes  Eyes red/itching:no Eye drainage/crusting: no  Nausea: no  Vomiting: no Diarrhea: no  Change in appetite: no Loss of taste/smell: no  Rash: no Fatigue: yes Sick contacts: no Strep contacts: no  Context: fluctuating Recurrent sinusitis: no Treatments attempted: cough drops, vitamin D, ibuprofen, ocean nasal spray Relief with OTC medications: yes  Allergies  Allergen Reactions   Latex    Augmentin [Amoxicillin-Pot Clavulanate]     GI Upset   Codeine     Outpatient Encounter Medications as of 06/10/2021  Medication Sig Note   albuterol (VENTOLIN HFA) 108 (90 Base) MCG/ACT inhaler Inhale 2 puffs into the lungs every 6 (six) hours as needed for wheezing or shortness of breath.    guaiFENesin (MUCINEX) 600 MG 12 hr tablet Take 1 tablet (600 mg total) by mouth 2 (two) times daily as needed for cough or to loosen phlegm.    betamethasone dipropionate 0.05 % cream Apply topically 2 (two) times daily.    clonazePAM (KLONOPIN) 0.5 MG tablet Take 0.5 tablets (0.25 mg total) by mouth 2 (two) times daily as needed for anxiety. 11/07/2020: Afraid to take   naproxen (NAPROSYN) 500 MG tablet Take 1 tablet (500  mg total) by mouth 2 (two) times daily.    ondansetron (ZOFRAN) 4 MG tablet Take 1 tablet (4 mg total) by mouth every 8 (eight) hours as needed for nausea or vomiting.    tiZANidine (ZANAFLEX) 4 MG tablet Take 1 tablet (4 mg total) by mouth every 6 (six) hours as needed for muscle spasms.    trimethoprim-polymyxin b (POLYTRIM) ophthalmic solution Place 2 drops into the left eye every 4 (four) hours.    venlafaxine XR (EFFEXOR-XR) 150 MG 24 hr capsule TAKE 1 CAPSULE BY MOUTH ONCE DAILY WITH BREAKFAST    Vitamin D, Cholecalciferol, 25 MCG (1000 UT) CAPS Take 1 capsule by mouth daily    No facility-administered encounter medications on file as of 06/10/2021.    Patient Active Problem List   Diagnosis Date Noted   Abnormal Pap smear of cervix 03/26/2018   Anxiety    Heavy smoker (more than 20 cigarettes per day) 08/06/2011    Past Medical History:  Diagnosis Date   Allergy    latex and codeine   Anxiety    on lexapro   Heavy smoker (more than 20 cigarettes per day) 08/06/2011   Upper respiratory tract infection 08/27/2020   Vaginal Pap smear, abnormal    LGSIL with HPV 2019 -colpo done    Relevant past medical, surgical, family and social history reviewed and updated as indicated. Interim medical history since our last visit reviewed.  Review of Systems Per HPI unless specifically indicated above     Objective:    There were no vitals taken  for this visit.  Wt Readings from Last 3 Encounters:  11/05/20 122 lb (55.3 kg)  11/03/20 121 lb 12.8 oz (55.2 kg)  10/16/20 123 lb (55.8 kg)    Physical Exam Physical examination unable to be performed due to lack of equipment.  Patient talking in complete sentences during telemedicine visit with occasional congested sounding cough.      Assessment & Plan:  1. Upper respiratory tract infection, unspecified type Acute.  Suspect viral etiology.  Reassured patient that symptoms are most consistent with a viral upper respiratory infection  and explained lack of efficacy of antibiotics against viruses.  Discussed expected course and features suggestive of secondary bacterial infection.  Continue supportive care. Increase fluid intake with water or electrolyte solution like pedialyte. Encouraged acetaminophen/ibuprofen as needed for fever/pain. Encouraged salt water gargling, chloraseptic spray and throat lozenges. Encouraged OTC guaifenesin. Encouraged saline sinus flushes and/or neti with humidified air.  If not improving near the end of the week, can consider adding in steroid for possible bronchitis.  Some concern with wheezing and congestion with smoking history.  Discussed that Zpack not indicated at this time.  Follow up if symptoms do not improve.  - guaiFENesin (MUCINEX) 600 MG 12 hr tablet; Take 1 tablet (600 mg total) by mouth 2 (two) times daily as needed for cough or to loosen phlegm.  Dispense: 30 tablet; Refill: 0  2. Wheezing Acute.  Concern for reactive airway vs. Obstructive airway process given smoking history.  Start albuterol inhaler as needed for wheezing and follow up with no improvement.  Can consider formal lung testing in the future. - albuterol (VENTOLIN HFA) 108 (90 Base) MCG/ACT inhaler; Inhale 2 puffs into the lungs every 6 (six) hours as needed for wheezing or shortness of breath.  Dispense: 8 g; Refill: 0    Follow up plan: Return if symptoms worsen or fail to improve.  This visit was completed via telephone due to the restrictions of the COVID-19 pandemic. All issues as above were discussed and addressed but no physical exam was performed. If it was felt that the patient should be evaluated in the office, they were directed there. The patient verbally consented to this visit. Patient was unable to complete an audio/visual visit due to Technical difficulties. Location of the patient: home Location of the provider: work Those involved with this call:  Provider: Cathlean Marseilles, DNP, FNP-C CMA: n/a Front  Desk/Registration: Percival Spanish  Time spent on call:  16 minutes on the phone discussing health concerns. 25 minutes total spent in review of patient's record and preparation of their chart. I verified patient identity using two factors (patient name and date of birth). Patient consents verbally to being seen via telemedicine visit today.

## 2021-06-11 ENCOUNTER — Encounter: Payer: Self-pay | Admitting: Nurse Practitioner

## 2021-06-11 ENCOUNTER — Telehealth: Payer: Self-pay

## 2021-06-11 NOTE — Telephone Encounter (Signed)
Pt called in stating that she would like a letter about her telehealth visit with NP yesterday. Pt stated that NP could send letter to return to work to her MyChart. Pt stated that she would like to return to work tomorrow (06/12/2021). Please advise.   Cb#: [phone]

## 2021-06-12 ENCOUNTER — Telehealth: Payer: Self-pay | Admitting: Family Medicine

## 2021-06-12 ENCOUNTER — Encounter: Payer: Self-pay | Admitting: Nurse Practitioner

## 2021-06-12 NOTE — Telephone Encounter (Signed)
Received call from patient in regards to URI Sx. States that she is not feeling any better and cannot tolerate prednisone. Requested ABTX.   Call placed to patient. LMTRC.

## 2021-06-12 NOTE — Telephone Encounter (Signed)
Patient called to request excuse letter for work be revised; stated it looks like she has COVID and does not reflect conversation with provider about the following:  - Dx: URI - if not better by 10/21, patient should stay out of work and follow up with provider  - if sx improved, ok to return to work.  Patient returned to work today and needs to submit note to employer asap.  Please advise at [phone]

## 2021-06-12 NOTE — Telephone Encounter (Signed)
The note does not say she has COVID.  It says she can return to work today.  Please find out what the note needs to say and okay to send.

## 2021-06-12 NOTE — Telephone Encounter (Signed)
Note sent via mychart

## 2021-06-12 NOTE — Telephone Encounter (Signed)
Please advise 

## 2021-06-15 NOTE — Telephone Encounter (Signed)
Call placed to patient.  Patient reports that she continues to have cough with some chest congestion and mucus production that is mainly clear and yellow, but has had some green to it. Reports that she continues to have nasal congestion and sinus pressure.   States that she is taking Mucinex and nasal saline. Reports that she is unable to take Prednisone as she is sensitive to it.   Advised that URI are mostly viral and can last 7-10 days. Advised that Sx are improving, but may linger.   Also states that work note was unclear and requested it be re-worded so that there is no confusion. Letter transcribed.

## 2021-06-15 NOTE — Telephone Encounter (Signed)
Received VM from patient. Reports that Sx have not improved or resolved.   Call placed to patient. LMTRC.

## 2021-06-16 NOTE — Telephone Encounter (Signed)
Agree with plan.  Symptoms should gradually improve over next couple of days.  If they worsen, need to schedule in person visit

## 2021-06-18 ENCOUNTER — Other Ambulatory Visit: Payer: Self-pay

## 2021-06-18 ENCOUNTER — Encounter: Payer: Self-pay | Admitting: Nurse Practitioner

## 2021-06-18 ENCOUNTER — Ambulatory Visit: Payer: 59 | Admitting: Nurse Practitioner

## 2021-06-18 VITALS — BP 122/72 | HR 102 | Temp 98.1°F | Resp 18 | Ht 62.0 in | Wt 122.0 lb

## 2021-06-18 DIAGNOSIS — R062 Wheezing: Secondary | ICD-10-CM | POA: Diagnosis not present

## 2021-06-18 DIAGNOSIS — J209 Acute bronchitis, unspecified: Secondary | ICD-10-CM

## 2021-06-18 DIAGNOSIS — E559 Vitamin D deficiency, unspecified: Secondary | ICD-10-CM

## 2021-06-18 MED ORDER — ALBUTEROL SULFATE (2.5 MG/3ML) 0.083% IN NEBU
2.5000 mg | INHALATION_SOLUTION | Freq: Once | RESPIRATORY_TRACT | Status: AC
  Administered 2021-06-18: 2.5 mg via RESPIRATORY_TRACT

## 2021-06-18 MED ORDER — FLUTICASONE PROPIONATE 50 MCG/ACT NA SUSP: 2.0000 | Freq: Every day | NASAL | 6 refills | Status: DC

## 2021-06-18 NOTE — Progress Notes (Signed)
Subjective:    Patient ID: Angela [name], female    DOB: [birth year], 45 y.o.   MRN: [id number]  HPI: Angela [name] is a 45 y.o. female presenting for  Chief Complaint  Patient presents with   Cough    X11d, productive, green   Nasal Congestion    X11d, clear/yellow/green   UPPER RESPIRATORY TRACT INFECTION Onset: 11 days Fever: no; no body aches or chills Cough:  yes; productive and thick Shortness of breath: no Wheezing: yes at times with congestion; has not used albuterol Chest pain:  no; rib cage is sore from coughing Chest tightness: no Chest congestion: no Nasal congestion: yes Runny nose: no; this is better Post nasal drip: yes Sneezing: no Sore throat: yes Swollen glands: no Sinus pressure:  yes; around eyes and in cheeks  although this has improved from last week Headache: no Face pain: no Toothache: no Ear pain: no  Ear pressure: yes - popping feels like fluid Eyes red/itching:no Eye drainage/crusting: no  Nausea: no  Vomiting: no Diarrhea: no  Change in appetite: no  Loss of taste/smell: no  Rash: no; not new Fatigue: yes; better from last week Sick contacts: no Strep contacts: no  Context: better some Recurrent sinusitis: no Treatments attempted: Mucinex, fluids, saline spray last week Relief with OTC medications: yes  Patient also asks about her vitamin D level today.  She has been taking vitamin D3 1000 IU daily.  We last checked this more than 6 months ago.  Allergies  Allergen Reactions   Latex    Augmentin [Amoxicillin-Pot Clavulanate]     GI Upset   Codeine     Outpatient Encounter Medications as of 06/18/2021  Medication Sig Note   albuterol (VENTOLIN HFA) 108 (90 Base) MCG/ACT inhaler Inhale 2 puffs into the lungs every 6 (six) hours as needed for wheezing or shortness of breath.    betamethasone dipropionate 0.05 % cream Apply topically 2 (two) times daily.    clonazePAM (KLONOPIN) 0.5 MG tablet Take 0.5  tablets (0.25 mg total) by mouth 2 (two) times daily as needed for anxiety. 11/07/2020: Afraid to take   naproxen (NAPROSYN) 500 MG tablet Take 1 tablet (500 mg total) by mouth 2 (two) times daily.    ondansetron (ZOFRAN) 4 MG tablet Take 1 tablet (4 mg total) by mouth every 8 (eight) hours as needed for nausea or vomiting.    tiZANidine (ZANAFLEX) 4 MG tablet Take 1 tablet (4 mg total) by mouth every 6 (six) hours as needed for muscle spasms.    trimethoprim-polymyxin b (POLYTRIM) ophthalmic solution Place 2 drops into the left eye every 4 (four) hours.    venlafaxine XR (EFFEXOR-XR) 150 MG 24 hr capsule TAKE 1 CAPSULE BY MOUTH ONCE DAILY WITH BREAKFAST    Vitamin D, Cholecalciferol, 25 MCG (1000 UT) CAPS Take 1 capsule by mouth daily    fluticasone (FLONASE) 50 MCG/ACT nasal spray Place 2 sprays into both nostrils daily.    [DISCONTINUED] guaiFENesin (MUCINEX) 600 MG 12 hr tablet Take 1 tablet (600 mg total) by mouth 2 (two) times daily as needed for cough or to loosen phlegm. (Patient not taking: Reported on 06/18/2021)    [EXPIRED] albuterol (PROVENTIL) (2.5 MG/3ML) 0.083% nebulizer solution 2.5 mg     No facility-administered encounter medications on file as of 06/18/2021.    Patient Active Problem List   Diagnosis Date Noted   Abnormal Pap smear of cervix 03/26/2018   Anxiety    Heavy smoker (  more than 20 cigarettes per day) 08/06/2011    Past Medical History:  Diagnosis Date   Allergy    latex and codeine   Anxiety    on lexapro   Heavy smoker (more than 20 cigarettes per day) 08/06/2011   Upper respiratory tract infection 08/27/2020   Vaginal Pap smear, abnormal    LGSIL with HPV 2019 -colpo done    Relevant past medical, surgical, family and social history reviewed and updated as indicated. Interim medical history since our last visit reviewed.  Review of Systems Per HPI unless specifically indicated above     Objective:    BP 122/72 (BP Location: Left Arm, Patient  Position: Sitting)   Pulse (!) 102   Temp 98.1 F (36.7 C) (Temporal)   Resp 18   Ht 5\' 2"  (1.575 m)   Wt 122 lb (55.3 kg)   SpO2 94%   BMI 22.31 kg/m   Wt Readings from Last 3 Encounters:  06/18/21 122 lb (55.3 kg)  11/05/20 122 lb (55.3 kg)  11/03/20 121 lb 12.8 oz (55.2 kg)    Physical Exam Vitals and nursing note reviewed.  Constitutional:      General: She is not in acute distress.    Appearance: Normal appearance. She is not toxic-appearing.  HENT:     Head: Normocephalic and atraumatic.     Right Ear: Tympanic membrane, ear canal and external ear normal.     Left Ear: Tympanic membrane, ear canal and external ear normal.     Nose: Congestion and rhinorrhea present.     Mouth/Throat:     Mouth: Mucous membranes are moist.     Pharynx: Oropharynx is clear. No oropharyngeal exudate or posterior oropharyngeal erythema.  Eyes:     General: No scleral icterus.    Extraocular Movements: Extraocular movements intact.  Cardiovascular:     Rate and Rhythm: Normal rate.     Heart sounds: Normal heart sounds. No murmur heard. Pulmonary:     Effort: Pulmonary effort is normal. No respiratory distress.     Breath sounds: Wheezing present. No rhonchi or rales.  Musculoskeletal:     Cervical back: Normal range of motion.  Lymphadenopathy:     Cervical: No cervical adenopathy.  Skin:    General: Skin is warm and dry.     Capillary Refill: Capillary refill takes less than 2 seconds.     Coloration: Skin is not jaundiced or pale.     Findings: No erythema.  Neurological:     Mental Status: She is alert and oriented to person, place, and time.     Motor: No weakness.     Gait: Gait normal.  Psychiatric:        Mood and Affect: Mood normal.        Behavior: Behavior normal.        Thought Content: Thought content normal.        Judgment: Judgment normal.      Assessment & Plan:  1. Acute bronchitis, unspecified organism Acute.  I suspect patient's symptoms are viral in  etiology.  We gave a albuterol nebulizer in office today that her lung sounds are much improved and clear afterward.  Overall, it sounds like her symptoms are slowly improving.  Given her smoking history, we discussed the use of prednisone to help with inflammation in her airway, however she declines this prescription.  We discussed that at this time, an antibiotic is not indicated.  I do not think she has  a bacterial pneumonia at this time.  However, if her symptoms worsen, we can consider chest x-ray.  She should continue to push hydration, resume Mucinex, and start using albuterol inhaler as needed to help open airway.  If symptoms worsen, check chest x-ray and can consider antibiotics.  Also resume Flonase to help with sinus congestion and possible eustachian tube chronic dysfunction.  Can consider spirometry or pulmonary function testing in the future once patient is well given smoking history.  Patient verbalizes understanding and is in agreement to this plan.  - fluticasone (FLONASE) 50 MCG/ACT nasal spray; Place 2 sprays into both nostrils daily.  Dispense: 16 g; Refill: 6  2. Wheezing Breathing treatment given as above with much improvement in lung sounds.  - albuterol (PROVENTIL) (2.5 MG/3ML) 0.083% nebulizer solution 2.5 mg  3. Vitamin D deficiency We will recheck vitamin D level today.  Can continue supplementation for now.  - VITAMIN D 25 Hydroxy (Vit-D Deficiency, Fractures)    Follow up plan: Return if symptoms worsen or fail to improve.

## 2021-06-19 LAB — VITAMIN D 25 HYDROXY (VIT D DEFICIENCY, FRACTURES): Vit D, 25-Hydroxy: 33 ng/mL (ref 30–100)

## 2021-06-29 ENCOUNTER — Encounter: Payer: Self-pay | Admitting: Obstetrics & Gynecology

## 2021-06-29 ENCOUNTER — Ambulatory Visit (INDEPENDENT_AMBULATORY_CARE_PROVIDER_SITE_OTHER): Payer: 59 | Admitting: Obstetrics & Gynecology

## 2021-06-29 ENCOUNTER — Other Ambulatory Visit: Payer: Self-pay

## 2021-06-29 ENCOUNTER — Other Ambulatory Visit (HOSPITAL_COMMUNITY)
Admission: RE | Admit: 2021-06-29 | Discharge: 2021-06-29 | Disposition: A | Discharge status: Home / Self Care | Payer: 59 | Source: Ambulatory Visit | Attending: Obstetrics & Gynecology | Admitting: Obstetrics & Gynecology

## 2021-06-29 VITALS — BP 112/69 | HR 81 | Ht 62.0 in | Wt 122.0 lb

## 2021-06-29 DIAGNOSIS — Z01419 Encounter for gynecological examination (general) (routine) without abnormal findings: Secondary | ICD-10-CM

## 2021-06-29 DIAGNOSIS — R87612 Low grade squamous intraepithelial lesion on cytologic smear of cervix (LGSIL): Secondary | ICD-10-CM

## 2021-06-29 DIAGNOSIS — Z113 Encounter for screening for infections with a predominantly sexual mode of transmission: Secondary | ICD-10-CM | POA: Diagnosis not present

## 2021-06-29 NOTE — Progress Notes (Signed)
Subjective:     Angela [name] is a 45 y.o. female here for a routine exam. Current complaints: no GYN complaints. Has had issues with Psoriasis. She had an abnormal PAP and colpo last years.  Pt requests STI screen.    Gynecologic History Patient's last menstrual period was 06/12/2021 (exact date). Contraception: condoms Last Pap: 06/12/2021. Results were:  Positive Abnormal     HPV 16 Negative   HPV 18 / 45 Negative   Adequacy Satisfactory for evaluation; transformation zone component PRESENT.   Diagnosis - Low grade squamous intraepithelial lesion (LSIL) Abnormal    Comment Normal Reference Range HPV - Negative   Comment Normal Reference Range HPV 16 18 45 -Negative    08/04/2020 FINAL MICROSCOPIC DIAGNOSIS:   A. ENDOCERVIX, CURETTAGE:  - Low-grade squamous intraepithelial lesion, CIN-1 (mild dysplasia).  - Scanty benign endocervical mucosa.   B. CERVIX, BIOPSY:  - Low-grade squamous intraepithelial lesion, CIN-1 (mild dysplasia).  Last mammogram: 05/28/2021. Results were: Birad 2- benign cyst  Obstetric History OB History  Gravida Para Term Preterm AB Living  0 0 0 0 0 0  SAB IAB Ectopic Multiple Live Births  0 0 0 0     The following portions of the patient's history were reviewed and updated as appropriate: allergies, current medications, past family history, past medical history, past social history, past surgical history, and problem list.  Review of Systems Pertinent items are noted in HPI.    Objective:  BP 112/69   Pulse 81   Ht 5\' 2"  (1.575 m)   Wt 122 lb (55.3 kg)   LMP 06/12/2021 (Exact Date)   BMI 22.31 kg/m   General Appearance:    Alert, cooperative, no distress, appears stated age  Head:    Normocephalic, without obvious abnormality, atraumatic  Eyes:    conjunctiva/corneas clear, EOM's intact, both eyes  Ears:    Normal external ear canals, both ears  Nose:   Nares normal, septum midline, mucosa normal, no drainage    or sinus tenderness   Throat:   Lips, mucosa, and tongue normal; teeth and gums normal  Neck:   Supple, symmetrical, trachea midline, no adenopathy;    thyroid:  no enlargement/tenderness/nodules  Back:     Symmetric, no curvature, ROM normal, no CVA tenderness  Lungs:     respirations unlabored  Chest Wall:    No tenderness or deformity   Heart:    Regular rate and rhythm  Breast Exam:    No tenderness, masses, or nipple abnormality  Abdomen:     Soft, non-tender, bowel sounds active all four quadrants,    no masses, no organomegaly  Genitalia:    Normal female without lesion, discharge or tenderness     Extremities:   Extremities normal, atraumatic, no cyanosis or edema  Pulses:   2+ and symmetric all extremities  Skin:   Skin color, texture, turgor normal, no rashes or lesions     Assessment:    Healthy female exam.    Diagnoses and all orders for this visit:  Well female exam with routine gynecological exam -     Cytology - PAP( [facility]) -     Hepatitis B Surface AntiGEN -     Hepatitis C Antibody -     RPR -     HIV antibody (with reflex)  LGSIL on Pap smear of cervix  Screening for STD (sexually transmitted disease) -     Hepatitis B Surface AntiGEN -  Hepatitis C Antibody -     RPR -     HIV antibody (with reflex)   F/u in 1 year or sooner prn. If PAP abnormal, pt aware that she will need a repeat Colpo.   Angela [name], M.D., [facility]

## 2021-06-30 LAB — HIV ANTIBODY (ROUTINE TESTING W REFLEX): HIV Screen 4th Generation wRfx: NONREACTIVE

## 2021-06-30 LAB — HEPATITIS B SURFACE ANTIGEN: Hepatitis B Surface Ag: NEGATIVE

## 2021-06-30 LAB — HEPATITIS C ANTIBODY: Hep C Virus Ab: <0.1 s/co ratio (ref 0.0–0.9)

## 2021-06-30 LAB — RPR: RPR Ser Ql: NONREACTIVE

## 2021-07-06 ENCOUNTER — Telehealth: Payer: Self-pay

## 2021-07-06 LAB — CYTOLOGY - PAP
Chlamydia: NEGATIVE
Comment: NEGATIVE
Comment: NEGATIVE
Comment: NEGATIVE
Comment: NORMAL
HPV 16: NEGATIVE
HPV 18 / 45: NEGATIVE
High risk HPV: POSITIVE — AB
Neisseria Gonorrhea: NEGATIVE

## 2021-07-06 NOTE — Telephone Encounter (Signed)
Pt called requesting results. Pt made aware that her STI blood work is negative and her Pap is abnormal but we will discuss the Pap results in further detail after the provider reviews it. Pt states we can leave a vm or leave it on her Mychart.Understanding was voiced. Angela [name]

## 2021-07-06 NOTE — Telephone Encounter (Signed)
Pt called the office back requesting results. Pt made aware that per Dr. Erin Fulling, her STI screen was neg. Her PAP is abnormal and she needs a colpo. Understanding was voiced. Mildred Tuccillo l Izzy [place], CMA

## 2021-07-06 NOTE — Telephone Encounter (Signed)
-----   Message from Angela Rosenthal, MD sent at 07/06/2021 11:34 AM EST ----- Please call pt. Her STI screen was neg. Her PAP is abnormal and she needs a colpo.   Thanks,   Clh-S

## 2021-07-06 NOTE — Telephone Encounter (Signed)
Error

## 2021-07-15 ENCOUNTER — Telehealth: Payer: Self-pay | Admitting: Family Medicine

## 2021-07-15 NOTE — Telephone Encounter (Signed)
Rx already sent to pharmacy, LVM to advise.

## 2021-07-15 NOTE — Telephone Encounter (Signed)
Pt called in to make sure that this refill request for venlafaxine XR (EFFEXOR-XR) 150  had been received. Pt states that she will be out of this med by the weekend. Please advise   Cb#: [phone]

## 2021-07-20 ENCOUNTER — Telehealth: Payer: Self-pay | Admitting: Family Medicine

## 2021-07-20 NOTE — Telephone Encounter (Signed)
I have called and talked to patient and she is aware that the outstanding balance of $20.00 she states that she has paid that through the 3rd party vendor. She did make an additional payment on her account.

## 2021-07-20 NOTE — Telephone Encounter (Signed)
Patient disputing amount overdue from DOS 10/18/19 - already paid $20 copay. Patient stated balance hasn't been cleared. Offered to send in proof of payment if needed.  Please advise at [phone].

## 2021-07-29 ENCOUNTER — Other Ambulatory Visit (HOSPITAL_COMMUNITY)
Admission: RE | Admit: 2021-07-29 | Discharge: 2021-07-29 | Disposition: A | Discharge status: Home / Self Care | Payer: 59 | Source: Ambulatory Visit | Attending: Obstetrics & Gynecology | Admitting: Obstetrics & Gynecology

## 2021-07-29 ENCOUNTER — Ambulatory Visit (INDEPENDENT_AMBULATORY_CARE_PROVIDER_SITE_OTHER): Payer: 59 | Admitting: Obstetrics & Gynecology

## 2021-07-29 ENCOUNTER — Encounter: Payer: Self-pay | Admitting: Obstetrics & Gynecology

## 2021-07-29 ENCOUNTER — Other Ambulatory Visit: Payer: Self-pay

## 2021-07-29 VITALS — BP 112/74 | HR 82 | Ht 62.0 in | Wt 126.0 lb

## 2021-07-29 DIAGNOSIS — R87810 Cervical high risk human papillomavirus (HPV) DNA test positive: Secondary | ICD-10-CM

## 2021-07-29 DIAGNOSIS — Z01812 Encounter for preprocedural laboratory examination: Secondary | ICD-10-CM | POA: Diagnosis present

## 2021-07-29 DIAGNOSIS — R87612 Low grade squamous intraepithelial lesion on cytologic smear of cervix (LGSIL): Secondary | ICD-10-CM | POA: Insufficient documentation

## 2021-07-29 NOTE — Patient Instructions (Signed)
Loop Electrosurgical Excision Procedure Loop electrosurgical excision procedure (LEEP) is the cutting and removal (excision) of tissue from the cervix. The cervix is the bottom part of the uterus that opens into the vagina. The tissue that is removed from the cervix is examined to see if there are cancer cells or cells that might turn into cancer (precancerous cells). LEEP may be done when: You have abnormal bleeding from your cervix. You have an abnormal Pap test result. Your health care provider finds abnormalities on your cervix during an exam. LEEP typically only takes a few minutes and is often done in the health care provider's office. The procedure is safe for women who are trying to get pregnant. The procedure is usually not done during a menstrual period or during pregnancy. Tell a health care provider about: Any allergies you have. All medicines you are taking, including vitamins, herbs, eye drops, creams, and over-the-counter medicines. Any problems you or family members have had with anesthetic medicines. Any bleeding problems you have. Any medical conditions you have or have had. This includes current or past vaginal infections, such as herpes or STIs (sexually transmitted infections). Whether you are pregnant or may be pregnant. If you are having vaginal bleeding on the day of the procedure. What are the risks? Generally, this is a safe procedure. However, problems may occur, including: Infection. Bleeding. Allergic reactions to medicines. Changes or scarring in the cervix. Damage to nearby structures or organs. Increased risk of early (preterm) labor in future pregnancies. What happens before the procedure? Ask your health care provider about: Changing or stopping your regular medicines. This is especially important if you are taking diabetes medicines or blood thinners. Taking medicines such as aspirin and ibuprofen. These medicines can thin your blood. Do not take these  medicines unless your health care provider tells you to take them. Taking over-the-counter medicines, vitamins, herbs, and supplements. Your health care provider may recommend that you take pain medicine before the procedure. Ask your health care provider if you should plan to have a responsible adult take you home after the procedure. What happens during the procedure?  An instrument called a speculum will be placed in your vagina. This will allow your health care provider to see your cervix. You will be given a medicine to numb the area (local anesthetic). The medicine will be injected into your cervix and the surrounding area. A solution will be applied to your cervix. This solution will help the health care provider find the abnormal cells that need to be removed. A thin wire loop will be passed through your vagina to your cervix. The wire will remove layers of abnormal cervical cells. The wire will burn (cauterize) the cervical tissue with an electrical current during cell removal. Open blood vessels will be cauterized to prevent bleeding. You might feel some pressure, aching, and cramping. If you feel like you will faint during the procedure, tell your health care provider right away. A paste may be applied to the cauterized area of your cervix to help control bleeding. The sample of cervical tissue will be sent to a lab and looked at under a microscope. The procedure may vary among health care providers and hospitals. What can I expect after the procedure? After the procedure, it is common to have: Mild abdominal cramps that may last for up to 1 week. A small amount of pink-tinged or bloody vaginal discharge, including light to moderate bleeding, for 1-2 weeks. A brown- or black-colored discharge coming from your   vagina, if a paste was used on the cervix to control bleeding. It is up to you to get the results of your procedure. Ask your health care provider, or the department that is  doing the procedure, when your results will be ready. Follow these instructions at home: Take over-the-counter and prescription medicines only as told by your health care provider. Return to your normal activities as told by your health care provider. Ask your health care provider what activities are safe for you. Do not put anything in your vagina for 2 weeks after the procedure or until your health care provider says that it is okay. This includes tampons, creams, and douches. Do not have sex until your health care provider approves. Keep all follow-up visits. This is important. Contact a health care provider if: You have a fever or chills. You feel very weak. You have blood clots or bleeding that is heavier than a normal menstrual period. Bleeding that soaks a pad in less than 1 hour is considered heavy bleeding. You develop a bad-smelling discharge from your vagina. You have severe abdominal pain or cramping. Summary Loop electrosurgical excision procedure (LEEP) is the removal of tissue from the cervix. The removed tissue will be checked for precancerous cells or cancer cells. LEEP typically only takes a few minutes and is often done in your health care provider's office. Do not put anything in your vagina for 2 weeks after the procedure or until your health care provider says that it is okay. This includes tampons, creams, and douches. Ask your health care provider, or the department that is doing the procedure, when your results will be ready. This information is not intended to replace advice given to you by your health care provider. Make sure you discuss any questions you have with your health care provider. Document Revised: 01/14/2021 Document Reviewed: 01/14/2021 Elsevier Patient Education  2022 [employer] LEEP POST-PROCEDURE INSTRUCTIONS  You may take Ibuprofen, Aleve or Tylenol for pain if needed.  Cramping is normal.  You will have black and/or bloody discharge at first.   This will lighten and then turn clear before completely resolving.  This will take 2 to 3 weeks.  Put nothing in your vagina until the bleeding or discharge stops (usually 2 or3 days).  You need to call if you have redness around the biopsy site, if there is any unusual draining, if the bleeding is heavy, or if you are concerned.  Shower or bathe as normal  We will call you within one week with results or we will discuss the results at your follow-up appointment if needed.  You will need to return for a follow-up Pap smear as directed by your physician.

## 2021-07-29 NOTE — Progress Notes (Signed)
Patient given informed consent, signed copy in the chart, time out was performed.  Placed in lithotomy position. Cervix viewed with speculum and colposcope after application of acetic acid.  07/06/2021 High risk HPV Positive Abnormal    Neisseria Gonorrhea Negative   Chlamydia Negative   HPV 16 Negative   HPV 18 / 45 Negative   Adequacy Satisfactory for evaluation; transformation zone component PRESENT.   Diagnosis - Low grade squamous intraepithelial lesion (LSIL) Abnormal    Microorganisms Fungal organisms present consistent with Candida spp.   Comment Normal Reference Ranger Chlamydia - Negative   Comment Normal Reference Range Neisseria Gonorrhea - Negative   Comment Normal Reference Range HPV - Negative   Comment Normal Reference Range HPV 16 18 45 -Negative    08/04/2020 FINAL MICROSCOPIC DIAGNOSIS:   A. ENDOCERVIX, CURETTAGE:  - Low-grade squamous intraepithelial lesion, CIN-1 (mild dysplasia).  - Scanty benign endocervical mucosa.   B. CERVIX, BIOPSY:  - Low-grade squamous intraepithelial lesion, CIN-1 (mild dysplasia).   03/16/2018\ FINAL MICROSCOPIC DIAGNOSIS:   A. ENDOCERVIX, CURETTAGE:  - Low-grade squamous intraepithelial lesion, CIN-1 (mild dysplasia).  - Scanty benign endocervical mucosa.   B. CERVIX, BIOPSY:  - Low-grade squamous intraepithelial lesion, CIN-1 (mild dysplasia).   Colposcopy adequate?  yes Acetowhite lesions?  yes Punctation?  no Mosaicism?   no Abnormal vasculature?  no Biopsies?  yes ECC?  yes  Patient was given post procedure instructions.  Given that the LGSIL has persisted for >3 years, I have offered pt a LEEP if the bx this time reveal the same. Written info given on the procedure. Pt will read the info and decide once the results return.   Raquel Sayres L. Harraway-Smith, M.D., [facility]

## 2021-07-31 LAB — SURGICAL PATHOLOGY

## 2021-08-03 ENCOUNTER — Telehealth: Payer: Self-pay

## 2021-08-03 NOTE — Telephone Encounter (Signed)
Patient called and made aware of persistent LGSIL results from her colposcopy. Patient states she has been thinking about what she wants to do since her last visit. Patient states she wants to call her insurance and see how much a LEEP would be and then return call to [place] on how she would like to follow up. Armandina Stammer RN

## 2021-08-03 NOTE — Telephone Encounter (Signed)
-----   Message from Willodean Rosenthal, MD sent at 08/03/2021  8:40 AM EST ----- Please call pt. She has continued Low grade dysplasia. I recommend a LEEP. We discussed this at her last visit. She can wait until after the holidays if she desires.   Clh-S

## 2021-08-04 LAB — POCT URINE PREGNANCY: Preg Test, Ur: NEGATIVE

## 2022-01-11 ENCOUNTER — Other Ambulatory Visit: Payer: Self-pay | Admitting: Family Medicine

## 2022-01-12 NOTE — Telephone Encounter (Signed)
Patient called the office she states she doesn't have any more medication for tomorrow. She would like to get this picked up this evening.  CB# [phone]

## 2022-01-12 NOTE — Telephone Encounter (Signed)
Is this okay to refill Last ov 06/18/21 Last filled 12/11/21

## 2022-01-12 NOTE — Telephone Encounter (Signed)
Received eFax from pharmacy to request refill.  Efax received from  [facility] [address] [place] - [address]  [address] [place] [address]  Phone:  [phone]  Fax:  [phone]   Please advise pharmacist.

## 2022-01-12 NOTE — Telephone Encounter (Signed)
Patient called to follow up on refill request. Please advise at [phone].

## 2022-04-12 ENCOUNTER — Telehealth: Payer: Self-pay

## 2022-04-12 ENCOUNTER — Other Ambulatory Visit: Payer: Self-pay | Admitting: Family Medicine

## 2022-04-12 NOTE — Telephone Encounter (Signed)
Last seen in office on 06/18/21 for sick visit. Pt is in need of appointment before further medication refills. Thank you. Refill for 1 month on Effexor given today 04/12/2022. Mjp,lpn

## 2022-04-13 ENCOUNTER — Telehealth: Payer: Self-pay

## 2022-04-13 NOTE — Telephone Encounter (Signed)
Pt wanted to know if it will be safe for pt to take the Fluconazole along w/her effexor.  Pt is taking the fluconazole from her dentist.   Please advice

## 2022-04-13 NOTE — Telephone Encounter (Signed)
Spoke w/pt,advice per Dr. Tanya Nones, its fine. Pt voiced understanding. Nothing further

## 2022-05-05 ENCOUNTER — Encounter: Payer: Self-pay | Admitting: General Practice

## 2022-06-01 ENCOUNTER — Other Ambulatory Visit: Payer: Self-pay | Admitting: Obstetrics & Gynecology

## 2022-06-01 DIAGNOSIS — Z1231 Encounter for screening mammogram for malignant neoplasm of breast: Secondary | ICD-10-CM

## 2022-06-02 ENCOUNTER — Telehealth: Payer: Self-pay | Admitting: General Practice

## 2022-06-02 NOTE — Telephone Encounter (Signed)
Left message on VM informing pt that her appt time on 08/04/2022 has been changed to 2:50pm d/t provider will be in a meeting.  Asked pt to contact our office with any questions or concerns.

## 2022-07-01 ENCOUNTER — Ambulatory Visit
Admission: RE | Admit: 2022-07-01 | Discharge: 2022-07-01 | Disposition: A | Discharge status: Home / Self Care | Payer: 59 | Source: Ambulatory Visit | Attending: Obstetrics & Gynecology | Admitting: Obstetrics & Gynecology

## 2022-07-01 DIAGNOSIS — Z1231 Encounter for screening mammogram for malignant neoplasm of breast: Secondary | ICD-10-CM

## 2022-07-02 ENCOUNTER — Ambulatory Visit: Payer: 59 | Admitting: Family Medicine

## 2022-07-02 VITALS — BP 115/70 | HR 99 | Temp 98.7°F | Ht 62.0 in | Wt 131.0 lb

## 2022-07-02 DIAGNOSIS — F411 Generalized anxiety disorder: Secondary | ICD-10-CM | POA: Diagnosis not present

## 2022-07-02 DIAGNOSIS — E559 Vitamin D deficiency, unspecified: Secondary | ICD-10-CM | POA: Diagnosis not present

## 2022-07-02 DIAGNOSIS — Z1211 Encounter for screening for malignant neoplasm of colon: Secondary | ICD-10-CM

## 2022-07-02 MED ORDER — VENLAFAXINE HCL ER 150 MG PO CP24: 150.0000 mg | ORAL_CAPSULE | Freq: Every day | ORAL | 3 refills | Status: DC

## 2022-07-02 NOTE — Progress Notes (Signed)
Subjective:    Patient ID: Angela [name], female    DOB: [birth year], 46 y.o.   MRN: [id number]  Patient has a history of generalized anxiety disorder however thankfully, the patient is doing very well on the Effexor Exar 150 mg p.o. every morning.  She states that she is no longer having panic attacks.  She does report occasional night sweats but otherwise she is doing well.  She feels like the medication is helping her.  She states that it may have caused a little bit of weight gain but other than that she denies any significant side effects from the medication.  She recently had her mammogram and her Pap smear performed by her gynecologist.  She is due for colon cancer screening.  She is also due for fasting lab work.  She has a history of vitamin D deficiency but she is taking vitamin D Past Medical History:  Diagnosis Date   Allergy    latex and codeine   Anxiety    on lexapro   Heavy smoker (more than 20 cigarettes per day) 08/06/2011   Upper respiratory tract infection 08/27/2020   Vaginal Pap smear, abnormal    LGSIL with HPV 2019 -colpo done   No past surgical history on file. Current Outpatient Medications on File Prior to Visit  Medication Sig Dispense Refill   albuterol (VENTOLIN HFA) 108 (90 Base) MCG/ACT inhaler Inhale 2 puffs into the lungs every 6 (six) hours as needed for wheezing or shortness of breath. 8 g 0   betamethasone dipropionate 0.05 % cream Apply topically 2 (two) times daily.     clonazePAM (KLONOPIN) 0.5 MG tablet Take 0.5 tablets (0.25 mg total) by mouth 2 (two) times daily as needed for anxiety. 30 tablet 0   fluticasone (FLONASE) 50 MCG/ACT nasal spray Place 2 sprays into both nostrils daily. 16 g 6   naproxen (NAPROSYN) 500 MG tablet Take 1 tablet (500 mg total) by mouth 2 (two) times daily. 30 tablet 0   ondansetron (ZOFRAN) 4 MG tablet Take 1 tablet (4 mg total) by mouth every 8 (eight) hours as needed for nausea or vomiting. 20 tablet 0    tiZANidine (ZANAFLEX) 4 MG tablet Take 1 tablet (4 mg total) by mouth every 6 (six) hours as needed for muscle spasms. 30 tablet 0   trimethoprim-polymyxin b (POLYTRIM) ophthalmic solution Place 2 drops into the left eye every 4 (four) hours. 10 mL 0   venlafaxine XR (EFFEXOR-XR) 150 MG 24 hr capsule TAKE 1 CAPSULE BY MOUTH ONCE DAILY WITH BREAKFAST 90 capsule 0   Vitamin D, Cholecalciferol, 25 MCG (1000 UT) CAPS Take 1 capsule by mouth daily 90 capsule 1   No current facility-administered medications on file prior to visit.   Allergies  Allergen Reactions   Latex    Augmentin [Amoxicillin-Pot Clavulanate]     GI Upset   Codeine    Social History   Socioeconomic History   Marital status: Single    Spouse name: Not on file   Number of children: Not on file   Years of education: Not on file   Highest education level: High school graduate  Occupational History   Not on file  Tobacco Use   Smoking status: Every Day    Packs/day: 1.00    Years: 15.00    Total pack years: 15.00    Types: Cigarettes   Smokeless tobacco: Never   Tobacco comments:    trying to cut back and quit  Vaping Use   Vaping Use: Never used  Substance and Sexual Activity   Alcohol use: Yes    Comment: moderate   Drug use: No   Sexual activity: Yes    Birth control/protection: None  Other Topics Concern   Not on file  Social History Narrative   Not on file   Social Determinants of Health   Financial Resource Strain: Not on file  Food Insecurity: Not on file  Transportation Needs: Not on file  Physical Activity: Not on file  Stress: Not on file  Social Connections: Not on file  Intimate Partner Violence: Not on file      Review of Systems  All other systems reviewed and are negative.      Objective:   Physical Exam Constitutional:      General: She is not in acute distress.    Appearance: She is well-developed. She is not diaphoretic.  HENT:     Right Ear: External ear normal.      Left Ear: External ear normal.     Nose: Nose normal.     Mouth/Throat:     Pharynx: No oropharyngeal exudate.  Eyes:     Conjunctiva/sclera: Conjunctivae normal.  Cardiovascular:     Rate and Rhythm: Normal rate and regular rhythm.     Heart sounds: Normal heart sounds. No murmur heard.    No friction rub. No gallop.  Pulmonary:     Effort: Pulmonary effort is normal. No respiratory distress.     Breath sounds: Normal breath sounds. No wheezing or rales.  Abdominal:     General: Bowel sounds are normal.     Palpations: Abdomen is soft.  Musculoskeletal:     Cervical back: Neck supple.  Lymphadenopathy:     Cervical: No cervical adenopathy.           Assessment & Plan:  GAD (generalized anxiety disorder)  Vitamin D deficiency - Plan: VITAMIN D 25 Hydroxy (Vit-D Deficiency, Fractures), CBC with Differential/Platelet, COMPLETE METABOLIC PANEL WITH GFR  Colon cancer screening - Plan: Cologuard I am happy to continue the Effexor since it seems to be working well for the patient.  I will check a CBC a CMP and a vitamin D level given her history of vitamin D deficiency and I will schedule the patient for Cologuard to screen for colon cancer.  Her breast cancer screening and cervical cancer screening is up-to-date.

## 2022-07-03 LAB — VITAMIN D 25 HYDROXY (VIT D DEFICIENCY, FRACTURES): Vit D, 25-Hydroxy: 24 ng/mL — ABNORMAL LOW (ref 30–100)

## 2022-07-03 LAB — COMPLETE METABOLIC PANEL WITH GFR
AG Ratio: 1.8 (calc) (ref 1.0–2.5)
ALT: 9 U/L (ref 6–29)
AST: 12 U/L (ref 10–35)
Albumin: 4.4 g/dL (ref 3.6–5.1)
Alkaline phosphatase (APISO): 67 U/L (ref 31–125)
BUN: 13 mg/dL (ref 7–25)
CO2: 25 mmol/L (ref 20–32)
Calcium: 9.7 mg/dL (ref 8.6–10.2)
Chloride: 107 mmol/L (ref 98–110)
Creat: 0.71 mg/dL (ref 0.50–0.99)
Globulin: 2.4 g/dL (calc) (ref 1.9–3.7)
Glucose, Bld: 73 mg/dL (ref 65–99)
Potassium: 4.2 mmol/L (ref 3.5–5.3)
Sodium: 140 mmol/L (ref 135–146)
Total Bilirubin: 0.5 mg/dL (ref 0.2–1.2)
Total Protein: 6.8 g/dL (ref 6.1–8.1)
eGFR: 107 mL/min/{1.73_m2} (ref >=60)

## 2022-07-03 LAB — CBC WITH DIFFERENTIAL/PLATELET
Absolute Monocytes: 582 cells/uL (ref 200–950)
Basophils Absolute: 70 cells/uL (ref 0–200)
Basophils Relative: 1.1 %
Eosinophils Absolute: 128 cells/uL (ref 15–500)
Eosinophils Relative: 2 %
HCT: 42.6 % (ref 35.0–45.0)
Hemoglobin: 14.3 g/dL (ref 11.7–15.5)
Lymphs Abs: 1389 cells/uL (ref 850–3900)
MCH: 31.4 pg (ref 27.0–33.0)
MCHC: 33.6 g/dL (ref 32.0–36.0)
MCV: 93.4 fL (ref 80.0–100.0)
MPV: 10.4 fL (ref 7.5–12.5)
Monocytes Relative: 9.1 %
Neutro Abs: 4230 cells/uL (ref 1500–7800)
Neutrophils Relative %: 66.1 %
Platelets: 257 10*3/uL (ref 140–400)
RBC: 4.56 10*6/uL (ref 3.80–5.10)
RDW: 13.1 % (ref 11.0–15.0)
Total Lymphocyte: 21.7 %
WBC: 6.4 10*3/uL (ref 3.8–10.8)

## 2022-08-03 LAB — COLOGUARD: COLOGUARD: NEGATIVE

## 2022-08-04 ENCOUNTER — Ambulatory Visit: Payer: 59 | Admitting: Obstetrics & Gynecology

## 2022-08-11 ENCOUNTER — Ambulatory Visit: Payer: 59 | Admitting: Obstetrics & Gynecology

## 2022-08-18 ENCOUNTER — Other Ambulatory Visit (HOSPITAL_COMMUNITY)
Admission: RE | Admit: 2022-08-18 | Discharge: 2022-08-18 | Disposition: A | Discharge status: Home / Self Care | Payer: 59 | Source: Ambulatory Visit | Attending: Obstetrics & Gynecology | Admitting: Obstetrics & Gynecology

## 2022-08-18 ENCOUNTER — Ambulatory Visit (INDEPENDENT_AMBULATORY_CARE_PROVIDER_SITE_OTHER): Payer: 59 | Admitting: Obstetrics & Gynecology

## 2022-08-18 VITALS — BP 105/70 | HR 83 | Temp 98.6°F | Ht 62.0 in | Wt 133.0 lb

## 2022-08-18 DIAGNOSIS — Z01419 Encounter for gynecological examination (general) (routine) without abnormal findings: Secondary | ICD-10-CM

## 2022-08-18 DIAGNOSIS — N898 Other specified noninflammatory disorders of vagina: Secondary | ICD-10-CM

## 2022-08-18 DIAGNOSIS — Z1231 Encounter for screening mammogram for malignant neoplasm of breast: Secondary | ICD-10-CM | POA: Diagnosis not present

## 2022-08-18 DIAGNOSIS — R87612 Low grade squamous intraepithelial lesion on cytologic smear of cervix (LGSIL): Secondary | ICD-10-CM | POA: Diagnosis not present

## 2022-08-18 NOTE — Progress Notes (Signed)
Subjective:     Angela [name] is a 46 y.o. female here for a routine exam.  Current complaints: pt reports odor with bleeding. Does not note any other time. Thinks she may have blood in the vagina. Is at the end of her cycle.  Wants STI screening.   Recently rebaptized and has been involved in church and getting closer to God.   Gynecologic History No LMP recorded. Contraception: condoms; currently abstinence.  Last Pap: 06/29/2021. Results were:  High risk HPV Positive Abnormal   Neisseria Gonorrhea Negative  Chlamydia Negative  HPV 16 Negative  HPV 18 / 45 Negative  Adequacy Satisfactory for evaluation; transformation zone component PRESENT.  Diagnosis - Low grade squamous intraepithelial lesion (LSIL) Abnormal    Last mammogram: 05/28/2021. Results were: favor benign  Obstetric History OB History  Gravida Para Term Preterm AB Living  0 0 0 0 0 0  SAB IAB Ectopic Multiple Live Births  0 0 0 0      The following portions of the patient's history were reviewed and updated as appropriate: allergies, current medications, past family history, past medical history, past social history, past surgical history, and problem list.  Review of Systems Pertinent items are noted in HPI.    Objective:  BP 105/70   Pulse 83   Temp 98.6 F (37 C)   Ht 5\' 2"  (1.575 m)   Wt 133 lb (60.3 kg)   BMI 24.33 kg/m  General Appearance:    Alert, cooperative, no distress, appears stated age  Head:    Normocephalic, without obvious abnormality, atraumatic  Eyes:    conjunctiva/corneas clear, EOM's intact, both eyes  Ears:    Normal external ear canals, both ears  Nose:   Nares normal, septum midline, mucosa normal, no drainage    or sinus tenderness  Throat:   Lips, mucosa, and tongue normal; teeth and gums normal  Neck:   Supple, symmetrical, trachea midline, no adenopathy;    thyroid:  no enlargement/tenderness/nodules  Back:     Symmetric, no curvature, ROM normal, no CVA tenderness   Lungs:     respirations unlabored  Chest Wall:    No tenderness or deformity   Heart:    Regular rate and rhythm  Breast Exam:    No tenderness, masses, or nipple abnormality  Abdomen:     Soft, non-tender, bowel sounds active all four quadrants,    no masses, no organomegaly  Genitalia:    Normal female without lesion, discharge or tenderness   Normal exam; no blood in vault.   Extremities:   Extremities normal, atraumatic, no cyanosis or edema  Pulses:   2+ and symmetric all extremities  Skin:   Skin color, texture, turgor normal, no rashes or lesions     Assessment:    Healthy female exam.  STI screen  Vaginal odor Possible BV.    Plan:  Synetta [name] was seen today for annual exam.  Diagnoses and all orders for this visit:  Well female exam with routine gynecological exam -     Cancel: Cytology - PAP( [facility]) -     Cytology - PAP( [facility]) -     HIV antibody (with reflex) -     Hepatitis C Antibody -     Hepatitis B Surface AntiGEN -     RPR  Breast cancer screening by mammogram -     Cancel: MM DIGITAL SCREENING BILATERAL; Future  LGSIL on Pap smear of cervix -  Cancel: Cytology - PAP( [facility])  Vaginal odor -     Cervicovaginal ancillary only( )   F/u in 1 year or sooner prn   Cherrie Franca L. Harraway-Smith, M.D., [place] [place]

## 2022-08-19 LAB — HEPATITIS B SURFACE ANTIGEN: Hepatitis B Surface Ag: NEGATIVE

## 2022-08-19 LAB — HIV ANTIBODY (ROUTINE TESTING W REFLEX): HIV Screen 4th Generation wRfx: NONREACTIVE

## 2022-08-19 LAB — RPR: RPR Ser Ql: NONREACTIVE

## 2022-08-19 LAB — HEPATITIS C ANTIBODY: Hep C Virus Ab: NONREACTIVE

## 2022-08-20 ENCOUNTER — Other Ambulatory Visit: Payer: Self-pay | Admitting: Obstetrics & Gynecology

## 2022-08-20 DIAGNOSIS — B9689 Other specified bacterial agents as the cause of diseases classified elsewhere: Secondary | ICD-10-CM

## 2022-08-20 LAB — CERVICOVAGINAL ANCILLARY ONLY
Bacterial Vaginitis (gardnerella): POSITIVE — AB
Comment: NEGATIVE

## 2022-08-20 MED ORDER — METRONIDAZOLE 0.75 % VA GEL: 1.0000 | Freq: Every day | VAGINAL | 5 refills | Status: DC

## 2022-08-26 LAB — CYTOLOGY - PAP
Chlamydia: NEGATIVE
Comment: NEGATIVE
Comment: NEGATIVE
Comment: NEGATIVE
Comment: NEGATIVE
Comment: NEGATIVE
Comment: NORMAL
HPV 16: NEGATIVE
HPV 18 / 45: NEGATIVE
High risk HPV: POSITIVE — AB
Neisseria Gonorrhea: NEGATIVE
Trichomonas: NEGATIVE

## 2022-08-31 ENCOUNTER — Telehealth: Payer: Self-pay

## 2022-08-31 NOTE — Telephone Encounter (Signed)
Left message for patient to return call to office. Avner Stroder  RN 

## 2022-08-31 NOTE — Telephone Encounter (Signed)
-----   Message from Lavonia Drafts, MD sent at 08/30/2022 12:55 PM EST ----- Please call pt. She needs a colpo.   Thx  Clh-S

## 2022-09-04 IMAGING — MG DIGITAL SCREENING BILAT W/ TOMO W/ CAD
8 series · 9 of 24 positions shown · non-contrast
Comparison: Previous exam(s).

CLINICAL DATA: Screening.

EXAM:
DIGITAL SCREENING BILATERAL MAMMOGRAM WITH TOMO AND CAD

[R CC synth-2D]
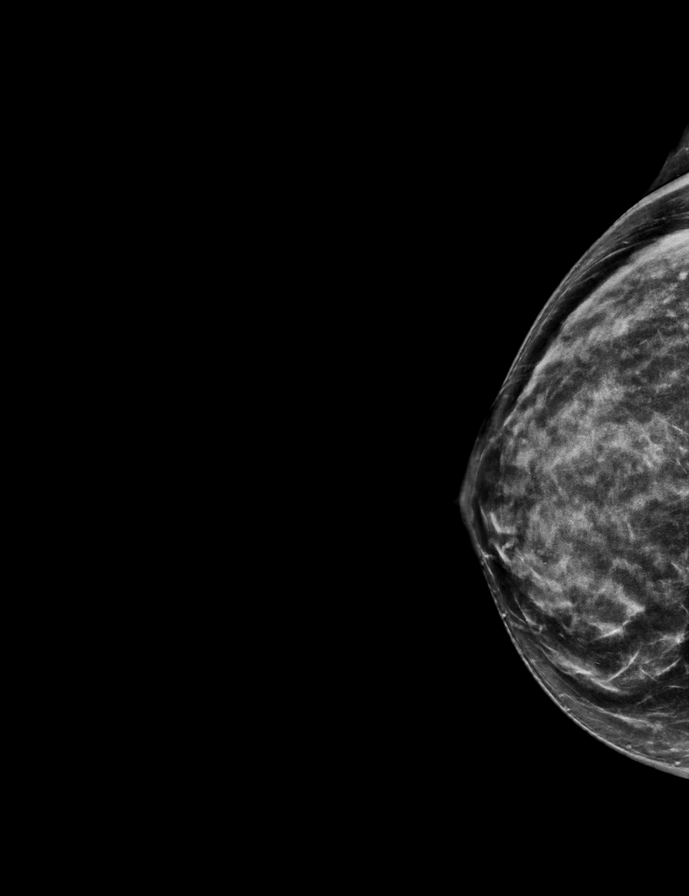

[R MLO synth-2D]
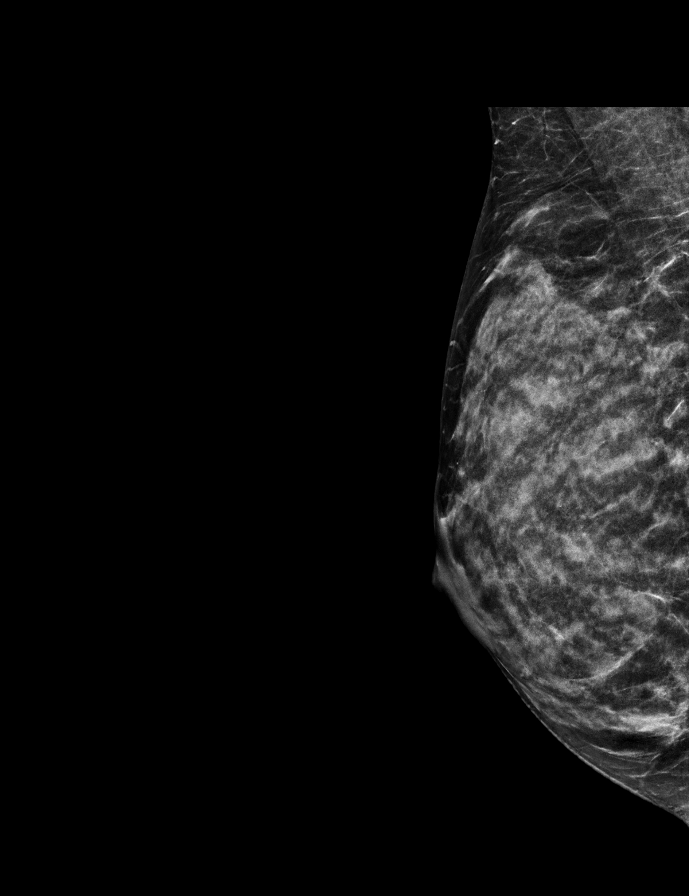

[L MLO synth-2D]
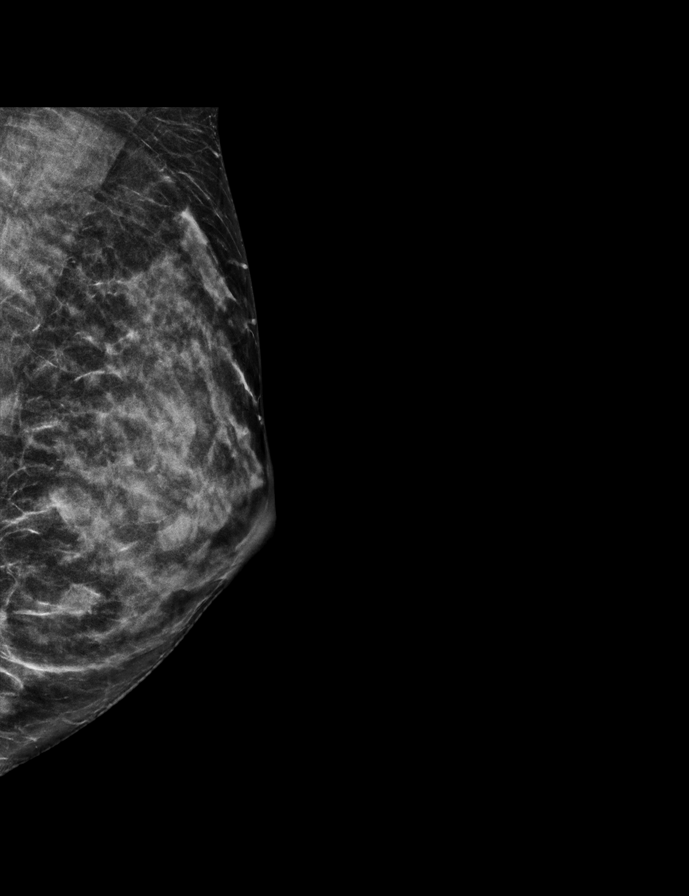

[L CC synth-2D]
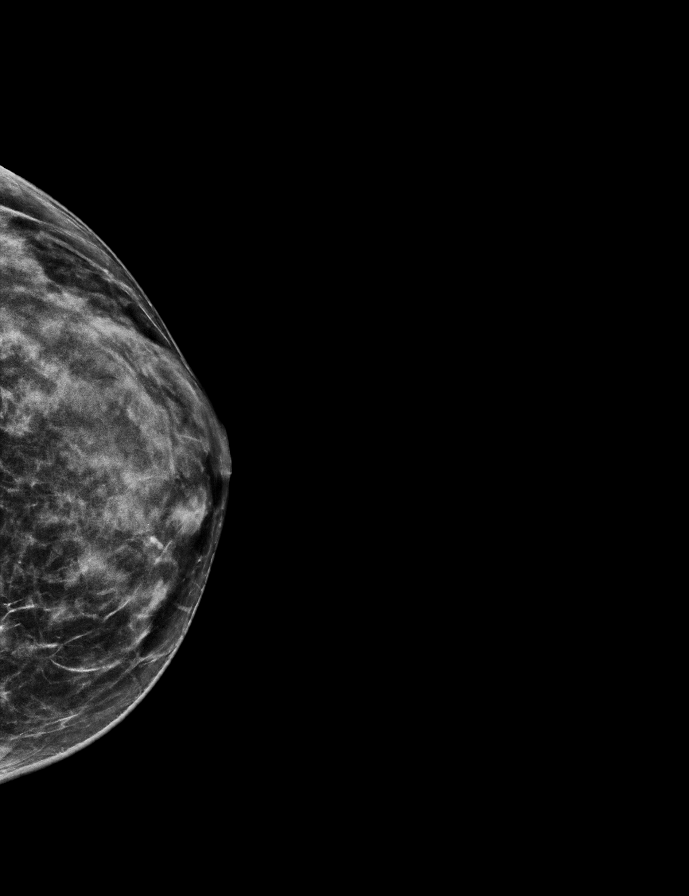

[L MLO tomo · 2 of 52 frames shown]
[frame 17/52]
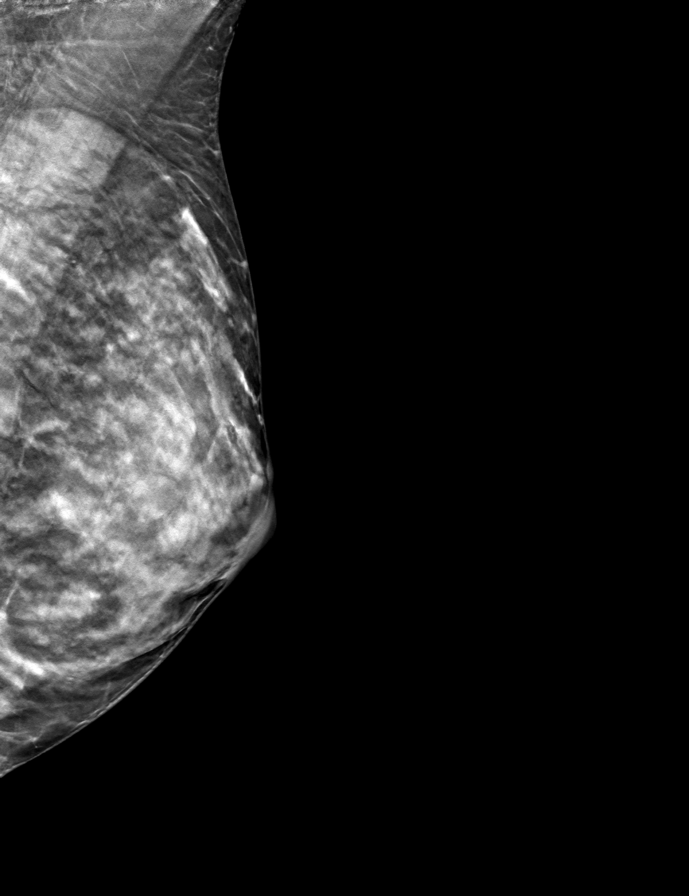
[frame 27/52]
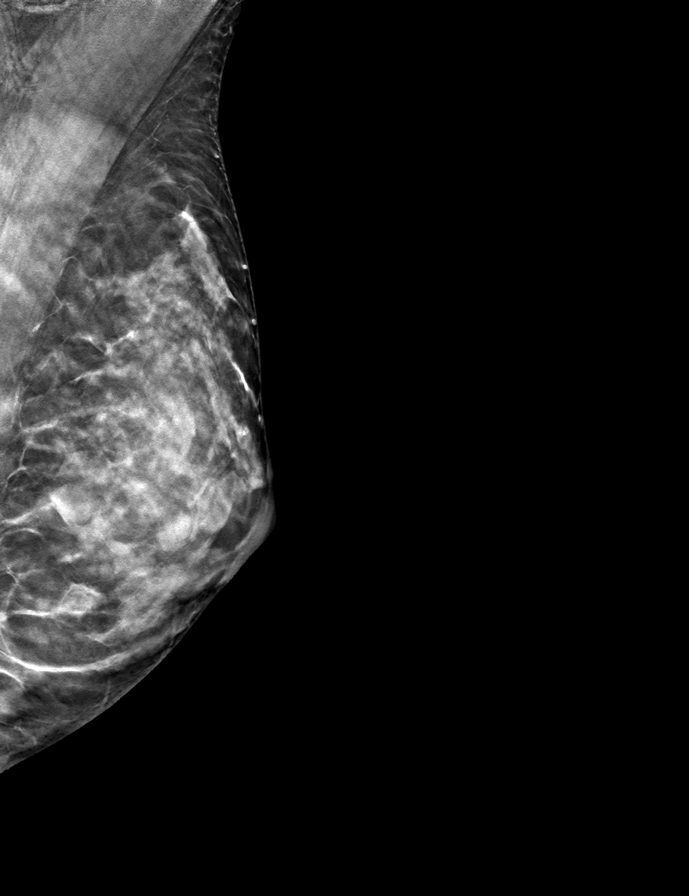

[R MLO tomo · tomo slice 25/48.0]
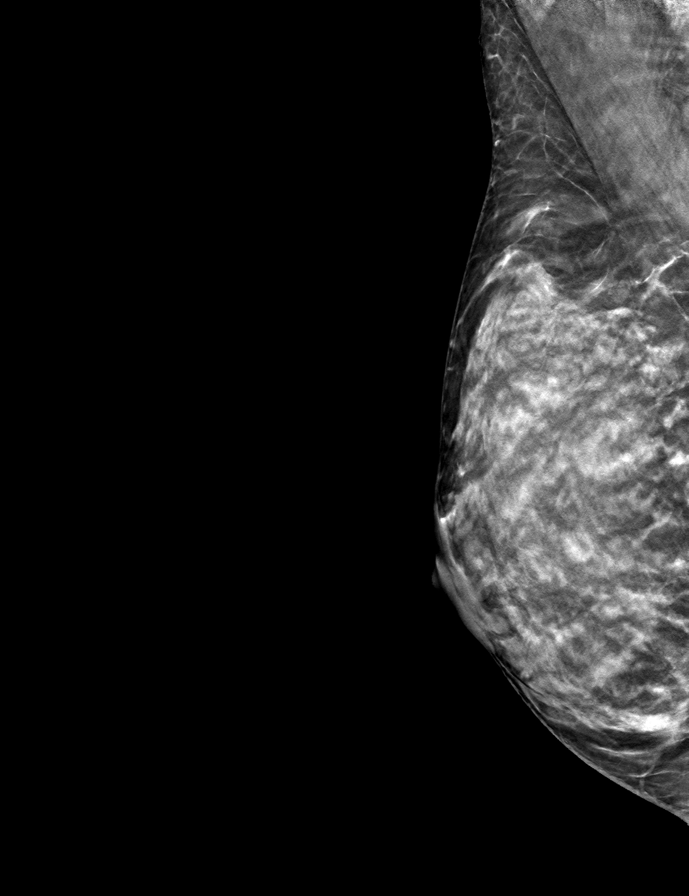

[L CC tomo · tomo slice 28/55.0]
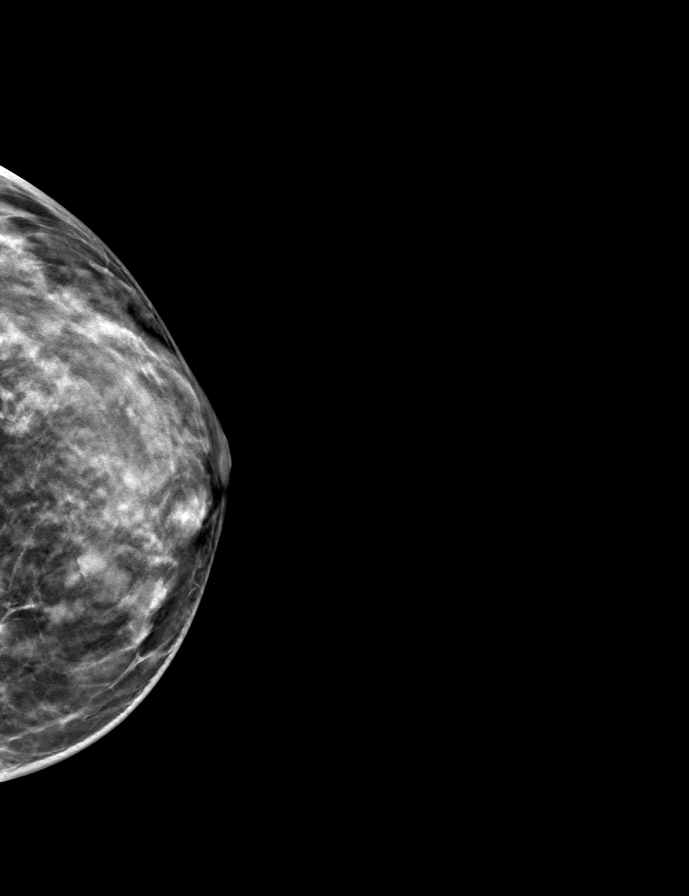

[R CC tomo · tomo slice 31/60.0]
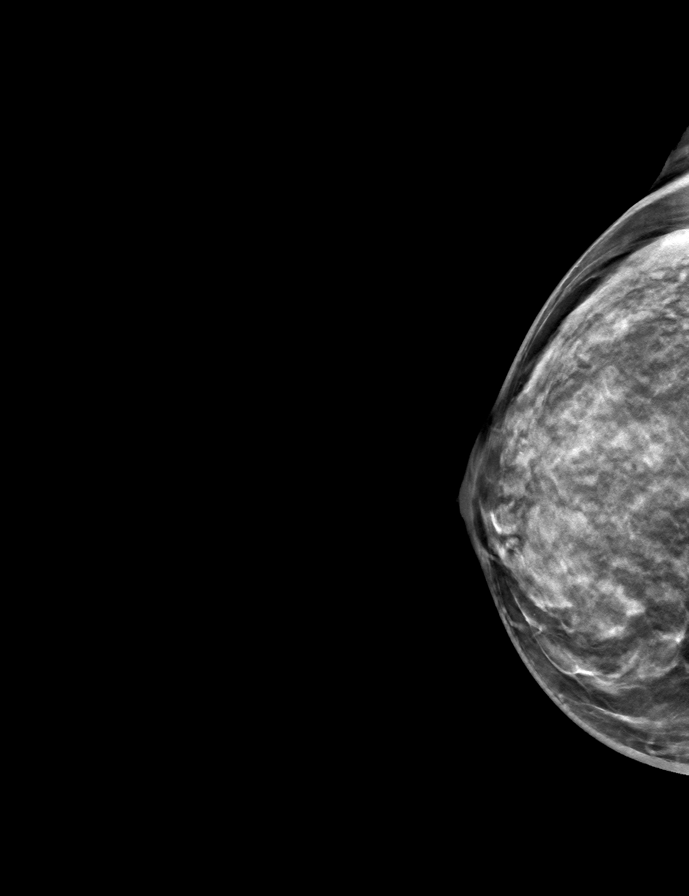

[9 of 24 positions shown; findings below may reference images not displayed]

ACR Breast Density Category d: The breast tissue is extremely dense,
which lowers the sensitivity of mammography
FINDINGS: There are no findings suspicious for malignancy. Images were
processed with CAD.
IMPRESSION: No mammographic evidence of malignancy. A result letter of this
screening mammogram will be mailed directly to the patient.

RECOMMENDATION:
Screening mammogram in one year. (Code:WO-0-ZI0)

BI-RADS CATEGORY  1: Negative.

## 2022-09-22 ENCOUNTER — Encounter: Payer: 59 | Admitting: Obstetrics & Gynecology

## 2022-09-29 ENCOUNTER — Encounter: Payer: 59 | Admitting: Obstetrics & Gynecology

## 2022-10-06 ENCOUNTER — Encounter: Payer: 59 | Admitting: Obstetrics & Gynecology

## 2022-11-15 ENCOUNTER — Encounter: Payer: Self-pay | Admitting: Obstetrics and Gynecology

## 2022-11-15 ENCOUNTER — Ambulatory Visit: Payer: 59 | Admitting: Obstetrics and Gynecology

## 2022-11-15 ENCOUNTER — Other Ambulatory Visit (HOSPITAL_COMMUNITY)
Admission: RE | Admit: 2022-11-15 | Discharge: 2022-11-15 | Disposition: A | Discharge status: Home / Self Care | Payer: 59 | Source: Ambulatory Visit | Attending: Obstetrics & Gynecology | Admitting: Obstetrics & Gynecology

## 2022-11-15 VITALS — BP 112/80 | HR 82 | Ht 62.0 in | Wt 133.0 lb

## 2022-11-15 DIAGNOSIS — R87612 Low grade squamous intraepithelial lesion on cytologic smear of cervix (LGSIL): Secondary | ICD-10-CM | POA: Diagnosis not present

## 2022-11-15 DIAGNOSIS — Z01812 Encounter for preprocedural laboratory examination: Secondary | ICD-10-CM

## 2022-11-15 DIAGNOSIS — R87810 Cervical high risk human papillomavirus (HPV) DNA test positive: Secondary | ICD-10-CM | POA: Diagnosis not present

## 2022-11-15 LAB — POCT URINE PREGNANCY: Preg Test, Ur: NEGATIVE

## 2022-11-15 NOTE — Progress Notes (Signed)
    GYNECOLOGY OFFICE COLPOSCOPY PROCEDURE NOTE  47 y.o. G0P0000 here for colposcopy for low-grade squamous intraepithelial neoplasia (LGSIL - encompassing HPV,mild dysplasia,CIN I) with other HR HPV on 07/2022. Discussed role for HPV in cervical dysplasia, need for surveillance.  Patient gave informed written consent, time out was performed.  Placed in lithotomy position. Cervix viewed with speculum and colposcope after application of acetic acid.   Colposcopy adequate? Yes  no mosaicism, no punctation, no abnormal vasculature, and acetowhite lesion(s) noted at 9 and 12 o'clock; corresponding biopsies obtained.  ECC specimen obtained. All specimens were labeled and sent to pathology.  Chaperone was present during entire procedure.  Patient was given post procedure instructions.  Will follow up pathology and manage accordingly; patient will be contacted with results and recommendations.  Routine preventative health maintenance measures emphasized.   Darliss Cheney, MD [place], [facility] for [employer], [place]

## 2022-11-17 LAB — SURGICAL PATHOLOGY

## 2022-12-06 ENCOUNTER — Ambulatory Visit: Payer: 59 | Admitting: Family Medicine

## 2022-12-06 ENCOUNTER — Encounter: Payer: Self-pay | Admitting: Family Medicine

## 2022-12-06 DIAGNOSIS — J019 Acute sinusitis, unspecified: Secondary | ICD-10-CM | POA: Diagnosis not present

## 2022-12-06 MED ORDER — AZITHROMYCIN 250 MG PO TABS: ORAL_TABLET | ORAL | 0 refills | Status: DC

## 2022-12-06 MED ORDER — FLUTICASONE PROPIONATE 50 MCG/ACT NA SUSP: 2.0000 | Freq: Every day | NASAL | 6 refills | Status: AC

## 2022-12-06 MED ORDER — LEVOCETIRIZINE DIHYDROCHLORIDE 5 MG PO TABS: 5.0000 mg | ORAL_TABLET | Freq: Every evening | ORAL | 11 refills | Status: AC

## 2022-12-06 NOTE — Progress Notes (Signed)
Subjective:    Patient ID: Angela [name], female    DOB: Nov 22, 1975, 47 y.o.   MRN: [id number]   Patient is a very pleasant 47 year old Caucasian female who presents today complaining of sore throat.  Symptoms began 4 days ago.  She complains of drainage and postnasal drip.  The drainage is going down her throat irritating her throat and causing laryngitis.  She also reports cough productive of green sputum.  There is no fever.  There is no wheezing.  There is no shortness of breath.  She denies any sinus pain.  She denies any headaches.  She denies any otalgia.  Biggest complaint is postnasal drip and sore throat.  She does have a history of allergies but she is not currently taking any allergy medication Past Medical History:  Diagnosis Date   Allergy    latex and codeine   Anxiety    on lexapro   Heavy smoker (more than 20 cigarettes per day) 08/06/2011   Upper respiratory tract infection 08/27/2020   Vaginal Pap smear, abnormal    LGSIL with HPV 2019 -colpo done   No past surgical history on file. Current Outpatient Medications on File Prior to Visit  Medication Sig Dispense Refill   albuterol (VENTOLIN HFA) 108 (90 Base) MCG/ACT inhaler Inhale 2 puffs into the lungs every 6 (six) hours as needed for wheezing or shortness of breath. (Patient not taking: Reported on 07/02/2022) 8 g 0   betamethasone dipropionate 0.05 % cream Apply topically 2 (two) times daily. (Patient not taking: Reported on 07/02/2022)     clonazePAM (KLONOPIN) 0.5 MG tablet Take 0.5 tablets (0.25 mg total) by mouth 2 (two) times daily as needed for anxiety. (Patient not taking: Reported on 07/02/2022) 30 tablet 0   fluticasone (FLONASE) 50 MCG/ACT nasal spray Place 2 sprays into both nostrils daily. 16 g 6   metroNIDAZOLE (METROGEL) 0.75 % vaginal gel Place 1 Applicatorful vaginally at bedtime. Apply one applicatorful to vagina at bedtime for 10 days, then twice a week for 6 months. (Patient not taking:  Reported on 11/15/2022) 70 g 5   naproxen (NAPROSYN) 500 MG tablet Take 1 tablet (500 mg total) by mouth 2 (two) times daily. (Patient not taking: Reported on 07/02/2022) 30 tablet 0   ondansetron (ZOFRAN) 4 MG tablet Take 1 tablet (4 mg total) by mouth every 8 (eight) hours as needed for nausea or vomiting. (Patient not taking: Reported on 07/02/2022) 20 tablet 0   tiZANidine (ZANAFLEX) 4 MG tablet Take 1 tablet (4 mg total) by mouth every 6 (six) hours as needed for muscle spasms. (Patient not taking: Reported on 07/02/2022) 30 tablet 0   venlafaxine XR (EFFEXOR-XR) 150 MG 24 hr capsule Take 1 capsule (150 mg total) by mouth daily with breakfast. 90 capsule 3   Vitamin D, Cholecalciferol, 25 MCG (1000 UT) CAPS Take 1 capsule by mouth daily 90 capsule 1   No current facility-administered medications on file prior to visit.   Allergies  Allergen Reactions   Latex    Augmentin [Amoxicillin-Pot Clavulanate]     GI Upset   Codeine    Social History   Socioeconomic History   Marital status: Single    Spouse name: Not on file   Number of children: Not on file   Years of education: Not on file   Highest education level: High school graduate  Occupational History   Not on file  Tobacco Use   Smoking status: Every Day  Packs/day: 1.00    Years: 15.00    Additional pack years: 0.00    Total pack years: 15.00    Types: Cigarettes   Smokeless tobacco: Never   Tobacco comments:    trying to cut back and quit  Vaping Use   Vaping Use: Never used  Substance and Sexual Activity   Alcohol use: Yes    Comment: moderate   Drug use: No   Sexual activity: Yes    Birth control/protection: None  Other Topics Concern   Not on file  Social History Narrative   Not on file   Social Determinants of Health   Financial Resource Strain: Not on file  Food Insecurity: Not on file  Transportation Needs: Not on file  Physical Activity: Not on file  Stress: Not on file  Social Connections: Not  on file  Intimate Partner Violence: Not on file      Review of Systems  All other systems reviewed and are negative.      Objective:   Physical Exam Constitutional:      General: She is not in acute distress.    Appearance: Normal appearance. She is well-developed and normal weight. She is not ill-appearing, toxic-appearing or diaphoretic.  HENT:     Right Ear: Tympanic membrane and external ear normal.     Left Ear: Tympanic membrane and external ear normal.     Nose: Congestion and rhinorrhea present.     Mouth/Throat:     Pharynx: No oropharyngeal exudate or posterior oropharyngeal erythema.  Eyes:     Conjunctiva/sclera: Conjunctivae normal.  Cardiovascular:     Rate and Rhythm: Normal rate and regular rhythm.     Heart sounds: Normal heart sounds. No murmur heard.    No friction rub. No gallop.  Pulmonary:     Effort: Pulmonary effort is normal. No respiratory distress.     Breath sounds: Normal breath sounds. No wheezing, rhonchi or rales.  Abdominal:     General: Bowel sounds are normal.     Palpations: Abdomen is soft.  Musculoskeletal:     Cervical back: Neck supple.  Lymphadenopathy:     Cervical: No cervical adenopathy.           Assessment & Plan:  Acute rhinosinusitis - Plan: fluticasone (FLONASE) 50 MCG/ACT nasal spray Patient is dealing with acute rhinosinusitis.  I believe this is most likely allergies although it could be a viral upper respiratory infection.  Recommended treating symptomatically with Xyzal is an antihistamine 5 mg daily and Flonase 2 sprays each nostril daily.  Anticipate symptoms to improve over the next 4 to 5 days.  If she develops sinus pain, fever, facial pain, she can start taking a Z-Pak for secondary sinusitis however I cautioned her not to take any antibiotic for at least the next 5 days to allow medication and time to work

## 2022-12-08 ENCOUNTER — Telehealth: Payer: Self-pay

## 2022-12-08 NOTE — Telephone Encounter (Signed)
Pt called in wanting to know if she could get a refill of fluconazole (DIFLUCAN) 150 MG tablet [[id number]]  ENDED. Pt thinks she may have a yeast infection. Pt would like a cb from nurse please.  Cb#: [phone]

## 2022-12-09 ENCOUNTER — Other Ambulatory Visit: Payer: Self-pay | Admitting: Family Medicine

## 2022-12-09 MED ORDER — FLUCONAZOLE 150 MG PO TABS: 150.0000 mg | ORAL_TABLET | Freq: Once | ORAL | 0 refills | Status: AC

## 2022-12-31 ENCOUNTER — Ambulatory Visit: Payer: 59 | Admitting: Family Medicine

## 2022-12-31 ENCOUNTER — Encounter: Payer: Self-pay | Admitting: Family Medicine

## 2022-12-31 VITALS — BP 116/70 | HR 100 | Temp 98.2°F | Ht 62.0 in | Wt 131.0 lb

## 2022-12-31 DIAGNOSIS — B079 Viral wart, unspecified: Secondary | ICD-10-CM | POA: Diagnosis not present

## 2022-12-31 DIAGNOSIS — E559 Vitamin D deficiency, unspecified: Secondary | ICD-10-CM

## 2022-12-31 NOTE — Progress Notes (Signed)
Subjective:    Patient ID: Angela [name], female    DOB: 07-27-1976, 47 y.o.   MRN: [id number]  Patient has a history of generalized anxiety disorder however thankfully, the patient is doing very well on the Effexor XR 150 mg p.o. every morning.  She states that she is no longer having panic attacks.  In November, her vitamin was found extremely low.  She started taking 5000 units of vitamin D a day and has been doing so for the last 6 months.  She is here today to recheck her vitamin D level.  She would like to continue Effexor.  She denies any side effects of the medication.  She does have a lesion on her left hand.  It is a wartlike papule that is approximately 4 mm in diameter over the radial side of the fingernail of her fifth digit Past Medical History:  Diagnosis Date  . Allergy    latex and codeine  . Anxiety    on lexapro  . Heavy smoker (more than 20 cigarettes per day) 08/06/2011  . Upper respiratory tract infection 08/27/2020  . Vaginal Pap smear, abnormal    LGSIL with HPV 2019 -colpo done   No past surgical history on file. Current Outpatient Medications on File Prior to Visit  Medication Sig Dispense Refill  . fluticasone (FLONASE) 50 MCG/ACT nasal spray Place 2 sprays into both nostrils daily. 16 g 6  . levocetirizine (XYZAL ALLERGY 24HR) 5 MG tablet Take 1 tablet (5 mg total) by mouth every evening. 30 tablet 11  . venlafaxine XR (EFFEXOR-XR) 150 MG 24 hr capsule Take 1 capsule (150 mg total) by mouth daily with breakfast. 90 capsule 3  . Vitamin D, Cholecalciferol, 25 MCG (1000 UT) CAPS Take 1 capsule by mouth daily 90 capsule 1  . azithromycin (ZITHROMAX) 250 MG tablet 2 tabs poqday1, 1 tab poqday 2-5 (Patient not taking: Reported on 12/31/2022) 6 tablet 0   No current facility-administered medications on file prior to visit.   Allergies  Allergen Reactions  . Latex   . Augmentin [Amoxicillin-Pot Clavulanate]     GI Upset  . Codeine    Social History    Socioeconomic History  . Marital status: Single    Spouse name: Not on file  . Number of children: Not on file  . Years of education: Not on file  . Highest education level: High school graduate  Occupational History  . Not on file  Tobacco Use  . Smoking status: Every Day    Packs/day: 1.00    Years: 15.00    Additional pack years: 0.00    Total pack years: 15.00    Types: Cigarettes  . Smokeless tobacco: Never  . Tobacco comments:    trying to cut back and quit  Vaping Use  . Vaping Use: Never used  Substance and Sexual Activity  . Alcohol use: Yes    Comment: moderate  . Drug use: No  . Sexual activity: Yes    Birth control/protection: None  Other Topics Concern  . Not on file  Social History Narrative  . Not on file   Social Determinants of Health   Financial Resource Strain: Not on file  Food Insecurity: Not on file  Transportation Needs: Not on file  Physical Activity: Not on file  Stress: Not on file  Social Connections: Not on file  Intimate Partner Violence: Not on file      Review of Systems  All other systems reviewed  and are negative.      Objective:   Physical Exam Constitutional:      General: She is not in acute distress.    Appearance: She is well-developed. She is not diaphoretic.  HENT:     Right Ear: External ear normal.     Left Ear: External ear normal.     Nose: Nose normal.     Mouth/Throat:     Pharynx: No oropharyngeal exudate.  Eyes:     Conjunctiva/sclera: Conjunctivae normal.  Cardiovascular:     Rate and Rhythm: Normal rate and regular rhythm.     Heart sounds: Normal heart sounds. No murmur heard.    No friction rub. No gallop.  Pulmonary:     Effort: Pulmonary effort is normal. No respiratory distress.     Breath sounds: Normal breath sounds. No wheezing or rales.  Abdominal:     General: Bowel sounds are normal.     Palpations: Abdomen is soft.  Musculoskeletal:       Hands:     Cervical back: Neck supple.   Lymphadenopathy:     Cervical: No cervical adenopathy.          Assessment & Plan:  Vitamin D deficiency - Plan: VITAMIN D 25 Hydroxy (Vit-D Deficiency, Fractures)  Wart of hand  I am happy to continue the Effexor since it seems to be working well for the patient.  Labs in November were excellent.  Will check a vitamin D level.  Vitamin D is still low I would encourage the patient to take 50,000 units.  Continue to encourage smoking cessation.  Treated the lesion on her hand with liquid nitrogen cryotherapy for a total of 30 seconds.  If persistent recommend shave biopsy

## 2023-01-01 LAB — VITAMIN D 25 HYDROXY (VIT D DEFICIENCY, FRACTURES): Vit D, 25-Hydroxy: 40 ng/mL (ref 30–100)

## 2023-01-06 ENCOUNTER — Other Ambulatory Visit: Payer: Self-pay | Admitting: Family Medicine

## 2023-01-07 NOTE — Telephone Encounter (Signed)
Requested medication (s) are due for refill today: routing for review  Requested medication (s) are on the active medication list: yes  Last refill:  12/09/22  Future visit scheduled: no  Notes to clinic:   Medication not assigned to a protocol, review manually.      Requested Prescriptions  Pending Prescriptions Disp Refills   fluconazole (DIFLUCAN) 150 MG tablet [Pharmacy Med Name: Fluconazole 150 MG Oral Tablet] 1 tablet 0    Sig: TAKE ONE TABLET BY MOUTH AS A ONE-TIME DOSE     Off-Protocol Failed - 01/06/2023  6:43 PM      Failed - Medication not assigned to a protocol, review manually.      Failed - Valid encounter within last 12 months    Recent Outpatient Visits           1 year ago Acute bronchitis, unspecified organism   [facility] Valentino Nose, NP   1 year ago Upper respiratory tract infection, unspecified type   [facility] Valentino Nose, NP   2 years ago GAD (generalized anxiety disorder)   [facility] Pickard, Priscille Heidelberg, MD   2 years ago GAD (generalized anxiety disorder)   [facility] Pickard, Priscille Heidelberg, MD   2 years ago Postviral fatigue syndrome   [facility] Valentino Nose, NP

## 2023-03-07 ENCOUNTER — Ambulatory Visit: Payer: Self-pay | Admitting: *Deleted

## 2023-03-07 NOTE — Telephone Encounter (Signed)
  Chief Complaint:Ear pain Symptoms: Left ear pain S/P swimming last Tuesday; worsening over weekend. "Clogged" Decreased hearing Frequency: 6 days Pertinent Negatives: Patient denies fever, drainage Disposition: [] ED /[] Urgent Care (no appt availability in office) / [x] Appointment(In office/virtual)/ []  [facility]/ [] Home Care/ [] Refused Recommended Disposition /[] [facility]/ []  Follow-up with PCP Additional Notes: Appt secured by practice for Wednesday 03/09/23. Pt requesting care advise. Care advise provided per protocol. Pt requested early AM appt tomorrow if possible. Does have some hearing loss. Assured pt NT would route to practice for PCPs review and final disposition. Reason for Disposition  Decreased hearing (or another adult says that the ear canal is completely blocked with discharge)  Answer Assessment - Initial Assessment Questions 1. LOCATION: "Which ear is involved?"      Left 2. SYMPTOMS: "What are the main symptoms?" (e.g., pain, redness, itching, discharge)     Pain, clogged  3. MOVEMENT: "Does the pain increase when the ear is moved up and down?" Does pushing on the tab of tissue in the front of the ear increase the pain?"      yes 4. PAIN: "How bad is the pain?"  (Scale 1-10; mild, moderate or severe)   - MILD (1-3): doesn't interfere with normal activities    - MODERATE (4-7): interferes with normal activities or awakens from sleep    - SEVERE (8-10): excruciating pain, unable to do any normal activities      5/10 5. ONSET: "When did the ear symptoms start?"      Last Tuesday 6. DISCHARGE: "Is there any discharge? What color is it?"      No 7. SWIMMING: "Have you been swimming recently?" If Yes, ask: "How often do you swim? Is it in a pool, lake or ocean?"      Yes 8. COTTON EAR SWABS: "Do you use cotton ear swabs (Q-tips)? How often?" (e.g., never, daily, weekly)     Yes, outside only  Protocols used: Ear - Swimmer's-A-AH

## 2023-03-08 ENCOUNTER — Ambulatory Visit: Payer: 59 | Admitting: Family Medicine

## 2023-03-09 ENCOUNTER — Ambulatory Visit: Payer: 59 | Admitting: Family Medicine

## 2023-03-09 ENCOUNTER — Encounter: Payer: Self-pay | Admitting: Family Medicine

## 2023-03-09 VITALS — BP 100/70 | HR 95 | Temp 97.9°F | Ht 62.0 in | Wt 131.0 lb

## 2023-03-09 DIAGNOSIS — H60332 Swimmer's ear, left ear: Secondary | ICD-10-CM | POA: Diagnosis not present

## 2023-03-09 MED ORDER — CIPROFLOXACIN-DEXAMETHASONE 0.3-0.1 % OT SUSP: 4.0000 [drp] | Freq: Two times a day (BID) | OTIC | 0 refills | Status: AC

## 2023-03-09 NOTE — Progress Notes (Signed)
Subjective:  HPI: Angela [name] is a 47 y.o. female presenting on 03/09/2023 for Follow-up (L ear clogged; ear and surrounding area tender to touch (poss swimmer's ear); sx began after swimming last week - unable to hear - JBG\\\)   HPI Patient is in today for swelling, redness, and tenderness to her left ear. She does also have severe eczema and redness and peeling of the skin at baseline. She denies ear drainage, fever, chills, body aches. Has tried keeping the ear dry.  Review of Systems  All other systems reviewed and are negative.   Relevant past medical history reviewed and updated as indicated.   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  Allergies and medications reviewed and updated.   Current Outpatient Medications:    ciprofloxacin-dexamethasone (CIPRODEX) OTIC suspension, Place 4 drops into the left ear 2 (two) times daily for 7 days., Disp: 3 mL, Rfl: 0   fluticasone (FLONASE) 50 MCG/ACT nasal spray, Place 2 sprays into both nostrils daily., Disp: 16 g, Rfl: 6   levocetirizine (XYZAL ALLERGY 24HR) 5 MG tablet, Take 1 tablet (5 mg total) by mouth every evening., Disp: 30 tablet, Rfl: 11   venlafaxine XR (EFFEXOR-XR) 150 MG 24 hr capsule, Take 1 capsule (150 mg total) by mouth daily with breakfast., Disp: 90 capsule, Rfl: 3   Vitamin D, Cholecalciferol, 25 MCG (1000 UT) CAPS, Take 1 capsule by mouth daily, Disp: 90 capsule, Rfl: 1   azithromycin (ZITHROMAX) 250 MG tablet, 2 tabs poqday1, 1 tab poqday 2-5 (Patient not taking: Reported on 03/09/2023), Disp: 6 tablet, Rfl: 0  Allergies  Allergen Reactions   Latex    Augmentin [Amoxicillin-Pot Clavulanate]     GI Upset   Codeine     Objective:    BP 100/70   Pulse 95   Temp 97.9 F (36.6 C) (Oral)   Ht 5\' 2"  (1.575 m)   Wt 131 lb (59.4 kg)   LMP  (Approximate)   SpO2 95%   BMI 23.96 kg/m      03/09/2023    8:42 AM 12/31/2022    8:15 AM 12/06/2022    2:26 PM  Vitals with BMI  Height 5\' 2"  5\' 2"  5\' 2"   Weight 131 lbs 131 lbs 130 lbs  BMI 23.95 23.95 23.77  Systolic 100 116 213  Diastolic 70 70 72  Pulse 95 100 94     Physical Exam Vitals and nursing note reviewed.  Constitutional:      Appearance: Normal appearance. She is normal weight.  HENT:     Head: Normocephalic and atraumatic.     Right Ear: Tympanic membrane normal.     Left Ear: Decreased hearing noted. Swelling and tenderness present. No drainage.     Ears:     Comments: External ear erythema and scaling bilaterally, unable to view left TM due to swelling Skin:    General: Skin is warm and dry.  Neurological:     General: No focal deficit present.     Mental Status: She is alert and oriented to person, place, and time. Mental status is at baseline.  Psychiatric:  Mood and Affect: Mood normal.        Behavior: Behavior normal.        Thought Content: Thought content normal.        Judgment: Judgment normal.     Assessment & Plan:  Acute swimmer's ear of left side Assessment & Plan: Start Ciprodex 4 drop BID for 7 days. Keep ear dry, may use cotton swap with vaseline when showering. Symptoms should begin to improve in 48 hours. If symptoms persist or worsen return to office.   Other orders -     Ciprofloxacin-dexAMETHasone; Place 4 drops into the left ear 2 (two) times daily for 7 days.  Dispense: 3 mL; Refill: 0     Follow up plan: Return if symptoms worsen or fail to improve.  Park Meo, FNP

## 2023-03-09 NOTE — Assessment & Plan Note (Signed)
Start Ciprodex 4 drop BID for 7 days. Keep ear dry, may use cotton swap with vaseline when showering. Symptoms should begin to improve in 48 hours. If symptoms persist or worsen return to office.

## 2023-03-16 ENCOUNTER — Ambulatory Visit: Payer: 59 | Admitting: Family Medicine

## 2023-03-16 ENCOUNTER — Encounter: Payer: Self-pay | Admitting: Family Medicine

## 2023-03-16 VITALS — BP 120/70 | HR 98 | Temp 98.5°F | Ht 62.0 in | Wt 130.0 lb

## 2023-03-16 DIAGNOSIS — H60332 Swimmer's ear, left ear: Secondary | ICD-10-CM

## 2023-03-16 NOTE — Progress Notes (Signed)
Subjective:  HPI: Angela [name] is a 47 y.o. female presenting on 03/16/2023 for Follow-up (follow up on ear pain - not showing signs of improvement - JBG\\\)   HPI Patient is in today for ongoing left ear tenderness with diminished hearing. She was seen by me 1 week ago with otitis externa and has almost completed her Ciprodex drops but reports her hearing loss is not improving. Her pain and tenderness is improving. She has been trying to keep water out of her ear. Denies fever, body aches, chills, worsening hearing loss, ringing in ears, or worsening pain.  Review of Systems  All other systems reviewed and are negative.   Relevant past medical history reviewed and updated as indicated.   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  Allergies and medications reviewed and updated.   Current Outpatient Medications:    azithromycin (ZITHROMAX) 250 MG tablet, 2 tabs poqday1, 1 tab poqday 2-5, Disp: 6 tablet, Rfl: 0   ciprofloxacin-dexamethasone (CIPRODEX) OTIC suspension, Place 4 drops into the left ear 2 (two) times daily for 7 days., Disp: 3 mL, Rfl: 0   fluticasone (FLONASE) 50 MCG/ACT nasal spray, Place 2 sprays into both nostrils daily., Disp: 16 g, Rfl: 6   levocetirizine (XYZAL ALLERGY 24HR) 5 MG tablet, Take 1 tablet (5 mg total) by mouth every evening., Disp: 30 tablet, Rfl: 11   venlafaxine XR (EFFEXOR-XR) 150 MG 24 hr capsule, Take 1 capsule (150 mg total) by mouth daily with breakfast., Disp: 90 capsule, Rfl: 3   Vitamin D, Cholecalciferol, 25 MCG (1000 UT) CAPS, Take 1 capsule by mouth daily, Disp: 90 capsule, Rfl: 1  Allergies  Allergen Reactions   Latex    Augmentin [Amoxicillin-Pot Clavulanate]      GI Upset   Codeine     Objective:   BP 120/70   Pulse 98   Temp 98.5 F (36.9 C) (Oral)   Ht 5\' 2"  (1.575 m)   Wt 130 lb (59 kg)   LMP  (Approximate)   SpO2 92%   BMI 23.78 kg/m      03/16/2023    8:02 AM 03/09/2023    8:42 AM 12/31/2022    8:15 AM  Vitals with BMI  Height 5\' 2"  5\' 2"  5\' 2"   Weight 130 lbs 131 lbs 131 lbs  BMI 23.77 23.95 23.95  Systolic 120 100 295  Diastolic 70 70 70  Pulse 98 95 100     Physical Exam Vitals and nursing note reviewed.  Constitutional:      Appearance: Normal appearance. She is normal weight.  HENT:     Head: Normocephalic and atraumatic.     Right Ear: Hearing, tympanic membrane, ear canal and external ear normal.     Left Ear: Decreased hearing noted. Swelling present.     Ears:     Comments: Unable to completely visualize TM due, outer ear erythema and flacking of skin due to eczema. Skin:    General: Skin is warm and dry.  Neurological:     General: No focal deficit present.     Mental Status: She is alert and oriented to person, place, and  time. Mental status is at baseline.  Psychiatric:        Mood and Affect: Mood normal.        Behavior: Behavior normal.        Thought Content: Thought content normal.        Judgment: Judgment normal.     Assessment & Plan:  Acute swimmer's ear of left side Assessment & Plan: Continue course of Ciprodex until complete. Keep ear dry, may use cotton swap with vaseline when showering. Will refer to ENT for further evaluation as I am unable to fully visualize TM and she has ongoing hearing loss.  Orders: -     Ambulatory referral to ENT     Follow up plan: Return if symptoms worsen or fail to improve.  Park Meo, FNP

## 2023-03-16 NOTE — Assessment & Plan Note (Signed)
Continue course of Ciprodex until complete. Keep ear dry, may use cotton swap with vaseline when showering. Will refer to ENT for further evaluation as I am unable to fully visualize TM and she has ongoing hearing loss.

## 2023-05-26 ENCOUNTER — Other Ambulatory Visit: Payer: Self-pay | Admitting: Obstetrics & Gynecology

## 2023-05-26 DIAGNOSIS — Z1231 Encounter for screening mammogram for malignant neoplasm of breast: Secondary | ICD-10-CM

## 2023-06-03 ENCOUNTER — Other Ambulatory Visit: Payer: Self-pay | Admitting: Family Medicine

## 2023-06-03 NOTE — Telephone Encounter (Signed)
Requested Prescriptions  Refused Prescriptions Disp Refills   venlafaxine XR (EFFEXOR-XR) 150 MG 24 hr capsule [Pharmacy Med Name: Venlafaxine HCl ER 150 MG Oral Capsule Extended Release 24 Hour] 90 capsule 0    Sig: TAKE 1 CAPSULE BY MOUTH ONCE DAILY WITH BREAKFAST . APPOINTMENT REQUIRED FOR FUTURE REFILLS     Psychiatry: Antidepressants - SNRI - desvenlafaxine & venlafaxine Failed - 06/03/2023  4:11 PM      Failed - Valid encounter within last 6 months    Recent Outpatient Visits           1 year ago Acute bronchitis, unspecified organism   [facility] Valentino Nose, NP   1 year ago Upper respiratory tract infection, unspecified type   [facility] Valentino Nose, NP   2 years ago GAD (generalized anxiety disorder)   [facility] Pickard, Priscille Heidelberg, MD   2 years ago GAD (generalized anxiety disorder)   [facility] Pickard, Priscille Heidelberg, MD   2 years ago Postviral fatigue syndrome   [facility] Cathlean Marseilles A, NP              Failed - Lipid Panel in normal range within the last 12 months    Cholesterol, Total  Date Value Ref Range Status  03/12/2019 201 (H) 100 - 199 mg/dL Final   Cholesterol  Date Value Ref Range Status  11/05/2020 201 (H) <200 mg/dL Final   LDL Cholesterol (Calc)  Date Value Ref Range Status  11/05/2020 122 (H) mg/dL (calc) Final    Comment:    Reference range: <100 . Desirable range <100 mg/dL for primary prevention;   <70 mg/dL for patients with CHD or diabetic patients  with > or = 2 CHD risk factors. Marland Kitchen LDL-C is now calculated using the Martin-Hopkins  calculation, which is a validated novel method providing  better accuracy than the Friedewald equation in the  estimation of LDL-C.  Horald Pollen et al. Lenox Ahr. 1610;960(45): 2061-2068  (http://education.[facility].com/faq/FAQ164)    HDL  Date Value Ref Range Status  11/05/2020 61 > OR = 50 mg/dL  Final  40/98/1191 64 >47 mg/dL Final   Triglycerides  Date Value Ref Range Status  11/05/2020 82 <150 mg/dL Final         Passed - Cr in normal range and within 360 days    Creat  Date Value Ref Range Status  07/02/2022 0.71 0.50 - 0.99 mg/dL Final         Passed - Last BP in normal range    BP Readings from Last 1 Encounters:  03/16/23 120/70

## 2023-06-04 ENCOUNTER — Other Ambulatory Visit: Payer: Self-pay | Admitting: Family Medicine

## 2023-06-06 ENCOUNTER — Other Ambulatory Visit: Payer: Self-pay

## 2023-06-06 ENCOUNTER — Telehealth: Payer: Self-pay

## 2023-06-06 DIAGNOSIS — F419 Anxiety disorder, unspecified: Secondary | ICD-10-CM

## 2023-06-06 MED ORDER — VENLAFAXINE HCL ER 150 MG PO CP24: 150.0000 mg | ORAL_CAPSULE | Freq: Every day | ORAL | 1 refills | Status: DC

## 2023-06-06 NOTE — Telephone Encounter (Signed)
Pt called in to request a refill of this med please:  venlafaxine XR (EFFEXOR-XR) 150 MG 24 hr capsule [[id number]]  LOV: 12/31/22  PHARMACY: [facility] [address], [place] - [address] [place] [address] Phone: [phone]  Fax:   CB#: [phone]

## 2023-06-06 NOTE — Telephone Encounter (Signed)
Requested medication (s) are due for refill today: yes no more refills at pharmacy  Requested medication (s) are on the active medication list: yes  Last refill:  07/02/22 #90/3  Future visit scheduled: no  Notes to clinic:  Unable to refill per protocol due to failed labs, no updated results.     Requested Prescriptions  Pending Prescriptions Disp Refills   venlafaxine XR (EFFEXOR-XR) 150 MG 24 hr capsule [Pharmacy Med Name: Venlafaxine HCl ER 150 MG Oral Capsule Extended Release 24 Hour] 90 capsule 0    Sig: TAKE 1 CAPSULE BY MOUTH ONCE DAILY WITH BREAKFAST . APPOINTMENT REQUIRED FOR FUTURE REFILLS     Psychiatry: Antidepressants - SNRI - desvenlafaxine & venlafaxine Failed - 06/04/2023 10:37 AM      Failed - Valid encounter within last 6 months    Recent Outpatient Visits           1 year ago Acute bronchitis, unspecified organism   [facility] Valentino Nose, NP   1 year ago Upper respiratory tract infection, unspecified type   [facility] Valentino Nose, NP   2 years ago GAD (generalized anxiety disorder)   [facility] Pickard, Priscille Heidelberg, MD   2 years ago GAD (generalized anxiety disorder)   [facility] Pickard, Priscille Heidelberg, MD   2 years ago Postviral fatigue syndrome   [facility] Cathlean Marseilles A, NP              Failed - Lipid Panel in normal range within the last 12 months    Cholesterol, Total  Date Value Ref Range Status  03/12/2019 201 (H) 100 - 199 mg/dL Final   Cholesterol  Date Value Ref Range Status  11/05/2020 201 (H) <200 mg/dL Final   LDL Cholesterol (Calc)  Date Value Ref Range Status  11/05/2020 122 (H) mg/dL (calc) Final    Comment:    Reference range: <100 . Desirable range <100 mg/dL for primary prevention;   <70 mg/dL for patients with CHD or diabetic patients  with > or = 2 CHD risk factors. Marland Kitchen LDL-C is now calculated using the Martin-Hopkins   calculation, which is a validated novel method providing  better accuracy than the Friedewald equation in the  estimation of LDL-C.  Horald Pollen et al. Lenox Ahr. 1610;960(45): 2061-2068  (http://education.[facility].com/faq/FAQ164)    HDL  Date Value Ref Range Status  11/05/2020 61 > OR = 50 mg/dL Final  40/98/1191 64 >47 mg/dL Final   Triglycerides  Date Value Ref Range Status  11/05/2020 82 <150 mg/dL Final         Passed - Cr in normal range and within 360 days    Creat  Date Value Ref Range Status  07/02/2022 0.71 0.50 - 0.99 mg/dL Final         Passed - Last BP in normal range    BP Readings from Last 1 Encounters:  03/16/23 120/70

## 2023-06-07 ENCOUNTER — Ambulatory Visit
Admission: EM | Admit: 2023-06-07 | Discharge: 2023-06-07 | Disposition: A | Discharge status: Home / Self Care | Payer: 59 | Attending: Family Medicine | Admitting: Family Medicine

## 2023-06-07 DIAGNOSIS — S39012A Strain of muscle, fascia and tendon of lower back, initial encounter: Secondary | ICD-10-CM | POA: Diagnosis not present

## 2023-06-07 MED ORDER — TIZANIDINE HCL 4 MG PO CAPS: 4.0000 mg | ORAL_CAPSULE | Freq: Three times a day (TID) | ORAL | 0 refills | Status: AC | PRN

## 2023-06-07 NOTE — ED Provider Notes (Signed)
[facility]    CSN: [id number] Arrival date & time: 06/07/23  1427      History   Chief Complaint No chief complaint on file.   HPI Angela [name] is a 47 y.o. female.   Presenting today with 3-day history of left lower back pain after carrying some heavy groceries in the other day.  She states the pain is worse with sitting for long periods or weightbearing.  Denies swelling or discoloration, radiation of pain down the legs, numbness, tingling, bowel or bladder incontinence, saddle anesthesias, fevers.  Ibuprofen helps for short amounts of times.   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 No pertinent surgical history.  OB History     Gravida  0   Para  0   Term  0   Preterm  0   AB  0   Living  0      SAB  0   IAB  0   Ectopic  0   Multiple  0   Live Births               Home Medications    Prior to Admission medications   Medication Sig Start Date End Date Taking? Authorizing Provider  tiZANidine (ZANAFLEX) 4 MG capsule Take 1 capsule (4 mg total) by mouth 3 (three) times daily as needed for muscle spasms. Do not drink alcohol or drive while taking this medication.  May cause drowsiness. 06/07/23  Yes Particia Nearing, PA-C  azithromycin [facility]) 250 MG tablet 2 tabs poqday1, 1 tab poqday 2-5 12/06/22   Donita Brooks, MD  fluticasone [facility]) 50 MCG/ACT nasal spray Place 2 sprays into both nostrils daily. 12/06/22   Donita Brooks, MD   levocetirizine (XYZAL ALLERGY 24HR) 5 MG tablet Take 1 tablet (5 mg total) by mouth every evening. 12/06/22   Donita Brooks, MD  venlafaxine XR (EFFEXOR-XR) 150 MG 24 hr capsule Take 1 capsule (150 mg total) by mouth daily with breakfast. 06/06/23   Donita Brooks, MD  Vitamin D, Cholecalciferol, 25 MCG (1000 UT) CAPS Take 1 capsule by mouth daily 11/10/20   Donita Brooks, MD    Family History Family History  Problem Relation Age of Onset   Breast cancer Mother 61   Throat cancer Maternal Grandmother    Prostate cancer Maternal Grandfather     Social History Social History   Tobacco Use   Smoking status: Every Day    Current packs/day: 1.00    Average packs/day: 1 pack/day for 15.0 years (15.0 ttl pk-yrs)    Types: Cigarettes   Smokeless tobacco: Never   Tobacco comments:    trying to cut back and quit  Vaping Use   Vaping status: Never Used  Substance Use Topics   Alcohol use: Yes    Comment: moderate   Drug use: No     Allergies   Latex, Augmentin [  amoxicillin-pot clavulanate], and Codeine   Review of Systems Review of Systems Per HPI  Physical Exam Triage Vital Signs ED Triage Vitals  Encounter Vitals Group     BP 06/07/23 1443 114/75     Systolic BP Percentile --      Diastolic BP Percentile --      Pulse Rate 06/07/23 1443 93     Resp 06/07/23 1450 16     Temp 06/07/23 1443 98.2 F (36.8 C)     Temp Source 06/07/23 1443 Oral     SpO2 06/07/23 1443 96 %     Weight --      Height --      Head Circumference --      Peak Flow --      Pain Score 06/07/23 1447 7     Pain Loc --      Pain Education --      Exclude from Growth Chart --    No data found.  Updated Vital Signs BP 114/75 (BP Location: Right Arm)   Pulse 93   Temp 98.2 F (36.8 C) (Oral)   Resp 16   LMP 05/18/2023 (Within Days)   SpO2 96%   Visual Acuity Right Eye Distance:   Left Eye Distance:   Bilateral Distance:    Right Eye Near:   Left Eye Near:    Bilateral  Near:     Physical Exam Vitals and nursing note reviewed.  Constitutional:      Appearance: Normal appearance. She is not ill-appearing.  HENT:     Head: Atraumatic.     Mouth/Throat:     Mouth: Mucous membranes are moist.  Eyes:     Extraocular Movements: Extraocular movements intact.     Conjunctiva/sclera: Conjunctivae normal.  Cardiovascular:     Rate and Rhythm: Normal rate and regular rhythm.     Heart sounds: Normal heart sounds.  Pulmonary:     Effort: Pulmonary effort is normal.     Breath sounds: Normal breath sounds.  Musculoskeletal:        General: Tenderness present. No swelling. Normal range of motion.     Cervical back: Normal range of motion and neck supple.     Comments: Left lateral lumbar musculature tender to palpation without bony deformity palpable.  No midline spinal tenderness to palpation diffusely.  Negative straight leg raise bilateral lower extremities.  Normal gait and range of motion  Skin:    General: Skin is warm and dry.  Neurological:     Mental Status: She is alert and oriented to person, place, and time.     Motor: No weakness.     Gait: Gait normal.  Psychiatric:        Mood and Affect: Mood normal.        Thought Content: Thought content normal.        Judgment: Judgment normal.     UC Treatments / Results  Labs (all labs ordered are listed, but only abnormal results are displayed) Labs Reviewed - No data to display  EKG   Radiology No results found.  Procedures Procedures (including critical care time)  Medications Ordered in UC Medications - No data to display  Initial Impression / Assessment and Plan / UC Course  I have reviewed the triage vital signs and the nursing notes.  Pertinent labs & imaging results that were available during my care of the patient were reviewed by me and considered in my medical decision making (see chart for  details).     Vital signs and exam reassuring today, consistent with lumbar  strain.  No red flag findings today.  Treat with Zanaflex, ibuprofen, heat, massage, stretches.  Return for worsening symptoms.  Work note given.  Final Clinical Impressions(s) / UC Diagnoses   Final diagnoses:  Strain of lumbar region, initial encounter   Discharge Instructions   None    ED Prescriptions     Medication Sig Dispense Auth. Provider   tiZANidine (ZANAFLEX) 4 MG capsule Take 1 capsule (4 mg total) by mouth 3 (three) times daily as needed for muscle spasms. Do not drink alcohol or drive while taking this medication.  May cause drowsiness. 15 capsule Particia Nearing, [place]      PDMP not reviewed this encounter.   Particia Nearing, [place] 06/07/23 1605

## 2023-06-07 NOTE — ED Triage Notes (Signed)
Pt c/o lower back pain x3 days. States it is worse on the left side and tender to the touch. Pain started after carrying in groceries. Has been using ice and ibuprofen which helps for a short amount of time.

## 2023-07-04 ENCOUNTER — Ambulatory Visit
Admission: RE | Admit: 2023-07-04 | Discharge: 2023-07-04 | Disposition: A | Discharge status: Home / Self Care | Payer: 59 | Source: Ambulatory Visit | Attending: Obstetrics & Gynecology | Admitting: Obstetrics & Gynecology

## 2023-07-04 DIAGNOSIS — Z1231 Encounter for screening mammogram for malignant neoplasm of breast: Secondary | ICD-10-CM

## 2023-07-25 ENCOUNTER — Other Ambulatory Visit: Payer: Self-pay

## 2023-07-25 DIAGNOSIS — B9689 Other specified bacterial agents as the cause of diseases classified elsewhere: Secondary | ICD-10-CM

## 2023-07-25 MED ORDER — METRONIDAZOLE 0.75 % VA GEL
1.0000 | Freq: Every day | VAGINAL | 5 refills | Status: AC
Start: 2023-07-25 — End: ?

## 2023-07-25 NOTE — Progress Notes (Signed)
Rx sent per protocol for bv.

## 2023-08-31 ENCOUNTER — Encounter: Payer: Self-pay | Admitting: Obstetrics & Gynecology

## 2023-08-31 ENCOUNTER — Other Ambulatory Visit (HOSPITAL_COMMUNITY)
Admission: RE | Admit: 2023-08-31 | Discharge: 2023-08-31 | Disposition: A | Discharge status: Home / Self Care | Payer: 59 | Source: Ambulatory Visit | Attending: Obstetrics & Gynecology | Admitting: Obstetrics & Gynecology

## 2023-08-31 ENCOUNTER — Ambulatory Visit (INDEPENDENT_AMBULATORY_CARE_PROVIDER_SITE_OTHER): Payer: 59 | Admitting: Obstetrics & Gynecology

## 2023-08-31 VITALS — BP 110/72 | HR 82 | Ht 61.0 in | Wt 134.0 lb

## 2023-08-31 DIAGNOSIS — Z113 Encounter for screening for infections with a predominantly sexual mode of transmission: Secondary | ICD-10-CM | POA: Diagnosis not present

## 2023-08-31 DIAGNOSIS — Z1339 Encounter for screening examination for other mental health and behavioral disorders: Secondary | ICD-10-CM

## 2023-08-31 DIAGNOSIS — Z1151 Encounter for screening for human papillomavirus (HPV): Secondary | ICD-10-CM | POA: Diagnosis not present

## 2023-08-31 DIAGNOSIS — Z01419 Encounter for gynecological examination (general) (routine) without abnormal findings: Secondary | ICD-10-CM

## 2023-08-31 NOTE — Progress Notes (Signed)
 Subjective:     Angela [name] is a 48 y.o. female here for a routine exam.  Current complaints: no GYN complaints. She is having monthly cycles. They last about 5-6 days there is often a gaps where it seems like its ending then it comes back for 2 days. All within the same cycle. No intermenstrual bleeding.       Gynecologic History Patient's last menstrual period was 08/12/2023 (approximate). Contraception: condoms Last Pap: 08/18/2022. Results were: LGSIL  Last mammogram: 07/04/2023. Results were: normal  Obstetric History OB History  Gravida Para Term Preterm AB Living  0 0 0 0 0 0  SAB IAB Ectopic Multiple Live Births  0 0 0 0      The following portions of the patient's history were reviewed and updated as appropriate: allergies, current medications, past family history, past medical history, past social history, past surgical history, and problem list.  Review of Systems Pertinent items are noted in HPI.    Objective:  BP 110/72   Pulse 82   Ht 5' 1 (1.549 m)   Wt 134 lb (60.8 kg)   LMP 08/12/2023 (Approximate)   BMI 25.32 kg/m  General Appearance:    Alert, cooperative, no distress, appears stated age  Head:    Normocephalic, without obvious abnormality, atraumatic  Eyes:    conjunctiva/corneas clear, EOM's intact, both eyes  Ears:    Normal external ear canals, both ears  Nose:   Nares normal, septum midline, mucosa normal, no drainage    or sinus tenderness  Throat:   Lips, mucosa, and tongue normal; teeth and gums normal  Neck:   Supple, symmetrical, trachea midline, no adenopathy;    thyroid :  no enlargement/tenderness/nodules  Back:     Symmetric, no curvature, ROM normal, no CVA tenderness  Lungs:     respirations unlabored  Chest Wall:    No tenderness or deformity   Heart:    Regular rate and rhythm  Breast Exam:    No tenderness, masses, or nipple abnormality  Abdomen:     Soft, non-tender, bowel sounds active all four quadrants,    no  masses, no organomegaly  Genitalia:    Normal female without lesion, discharge or tenderness     Extremities:   Extremities normal, atraumatic, no cyanosis or edema  Pulses:   2+ and symmetric all extremities  Skin:   Skin color, texture, turgor normal, psoriasis on shins bilaterally     Assessment:    Healthy female exam.  H/o abnormal PAP   Plan:  Brynna was seen today for gynecologic exam.  Diagnoses and all orders for this visit:  Well female exam with routine gynecological exam -     Cytology - PAP  Screening for STDs (sexually transmitted diseases) -     Hepatitis B surface antigen -     Hepatitis C antibody -     HIV Antibody (routine testing w rflx) -     RPR   F/u in 1 year or sooner prn   Darrin Koman L. Harraway-Smith, M.D., FACOG

## 2023-09-01 ENCOUNTER — Encounter: Payer: Self-pay | Admitting: Family Medicine

## 2023-09-01 ENCOUNTER — Ambulatory Visit (INDEPENDENT_AMBULATORY_CARE_PROVIDER_SITE_OTHER): Payer: 59 | Admitting: Family Medicine

## 2023-09-01 VITALS — BP 122/74 | HR 100 | Temp 99.0°F | Ht 61.0 in | Wt 132.0 lb

## 2023-09-01 DIAGNOSIS — L409 Psoriasis, unspecified: Secondary | ICD-10-CM | POA: Diagnosis not present

## 2023-09-01 DIAGNOSIS — Z13220 Encounter for screening for lipoid disorders: Secondary | ICD-10-CM

## 2023-09-01 DIAGNOSIS — F419 Anxiety disorder, unspecified: Secondary | ICD-10-CM | POA: Diagnosis not present

## 2023-09-01 DIAGNOSIS — E559 Vitamin D deficiency, unspecified: Secondary | ICD-10-CM | POA: Diagnosis not present

## 2023-09-01 LAB — HEPATITIS C ANTIBODY: Hep C Virus Ab: NONREACTIVE

## 2023-09-01 LAB — HIV ANTIBODY (ROUTINE TESTING W REFLEX): HIV Screen 4th Generation wRfx: NONREACTIVE

## 2023-09-01 LAB — RPR: RPR Ser Ql: NONREACTIVE

## 2023-09-01 LAB — HEPATITIS B SURFACE ANTIGEN: Hepatitis B Surface Ag: NEGATIVE

## 2023-09-01 MED ORDER — PIMECROLIMUS 1 % EX CREA: TOPICAL_CREAM | Freq: Two times a day (BID) | CUTANEOUS | 11 refills | Status: DC

## 2023-09-01 MED ORDER — VENLAFAXINE HCL ER 150 MG PO CP24: 150.0000 mg | ORAL_CAPSULE | Freq: Every day | ORAL | 3 refills | Status: DC

## 2023-09-01 NOTE — Progress Notes (Signed)
 Subjective:    Patient ID: Angela [name], female    DOB: Dec 25, 1975, 48 y.o.   MRN: [id number]  Patient has a history of generalized anxiety disorder.  Her blood pressure today is excellent.  Her gynecologist has performed a Pap smear and her mammogram.  Unfortunately she continues to smoke.  We discussed the long-term risk of this.  She also psoriasis.  It is tickly out of the years.  She has also had tip of nose as well as around her lips.  Unfortunately she has not responded to topical steroids.  She also cannot use those on her face.  She is hesitant to use Humira.  We discussed topical calcineurin inhibitors such as Elidel  and she would be willing to try this.  She is due to repeat her vitamin D . Past Medical History:  Diagnosis Date   Allergy    latex and codeine   Anxiety    on lexapro    Heavy smoker (more than 20 cigarettes per day) 08/06/2011   Upper respiratory tract infection 08/27/2020   Vaginal Pap smear, abnormal    LGSIL with HPV 2019 -colpo done   No past surgical history on file. Current Outpatient Medications on File Prior to Visit  Medication Sig Dispense Refill   azithromycin  (ZITHROMAX ) 250 MG tablet 2 tabs poqday1, 1 tab poqday 2-5 (Patient not taking: Reported on 08/31/2023) 6 tablet 0   fluticasone  (FLONASE ) 50 MCG/ACT nasal spray Place 2 sprays into both nostrils daily. 16 g 6   levocetirizine (XYZAL  ALLERGY 24HR) 5 MG tablet Take 1 tablet (5 mg total) by mouth every evening. 30 tablet 11   metroNIDAZOLE  (METROGEL ) 0.75 % vaginal gel Place 1 Applicatorful vaginally at bedtime. Apply one applicatorful to vagina at bedtime for 10 days, then twice a week for 6 months. (Patient not taking: Reported on 08/31/2023) 70 g 5   tiZANidine  (ZANAFLEX ) 4 MG capsule Take 1 capsule (4 mg total) by mouth 3 (three) times daily as needed for muscle spasms. Do not drink alcohol or drive while taking this medication.  May cause drowsiness. 15 capsule 0   venlafaxine  XR  (EFFEXOR -XR) 150 MG 24 hr capsule Take 1 capsule (150 mg total) by mouth daily with breakfast. 90 capsule 1   Vitamin D , Cholecalciferol , 25 MCG (1000 UT) CAPS Take 1 capsule by mouth daily 90 capsule 1   No current facility-administered medications on file prior to visit.   Allergies  Allergen Reactions   Latex    Augmentin  [Amoxicillin -Pot Clavulanate]     GI Upset   Codeine    Social History   Socioeconomic History   Marital status: Single    Spouse name: Not on file   Number of children: Not on file   Years of education: Not on file   Highest education level: High school graduate  Occupational History   Not on file  Tobacco Use   Smoking status: Every Day    Current packs/day: 1.00    Average packs/day: 1 pack/day for 15.0 years (15.0 ttl pk-yrs)    Types: Cigarettes   Smokeless tobacco: Never   Tobacco comments:    trying to cut back and quit  Vaping Use   Vaping status: Never Used  Substance and Sexual Activity   Alcohol use: Yes    Comment: moderate   Drug use: No   Sexual activity: Yes    Birth control/protection: None  Other Topics Concern   Not on file  Social History Narrative  Not on file   Social Drivers of Health   Financial Resource Strain: Not on file  Food Insecurity: Not on file  Transportation Needs: Not on file  Physical Activity: Not on file  Stress: Not on file  Social Connections: Not on file  Intimate Partner Violence: Not on file      Review of Systems  All other systems reviewed and are negative.      Objective:   Physical Exam Constitutional:      General: She is not in acute distress.    Appearance: She is well-developed. She is not diaphoretic.  HENT:     Head:      Right Ear: External ear normal.     Left Ear: External ear normal.     Nose: Nose normal.     Mouth/Throat:     Pharynx: No oropharyngeal exudate.  Eyes:     Conjunctiva/sclera: Conjunctivae normal.  Cardiovascular:     Rate and Rhythm: Normal rate  and regular rhythm.     Heart sounds: Normal heart sounds. No murmur heard.    No friction rub. No gallop.  Pulmonary:     Effort: Pulmonary effort is normal. No respiratory distress.     Breath sounds: Normal breath sounds. No wheezing or rales.  Abdominal:     General: Bowel sounds are normal.     Palpations: Abdomen is soft.  Musculoskeletal:     Cervical back: Neck supple.  Lymphadenopathy:     Cervical: No cervical adenopathy.           Assessment & Plan:  Vitamin D  deficiency - Plan: VITAMIN D  25 Hydroxy (Vit-D Deficiency, Fractures)  Screening cholesterol level - Plan: CBC with Differential/Platelet, COMPLETE METABOLIC PANEL WITH GFR, Lipid panel  Anxiety - Plan: venlafaxine  XR (EFFEXOR -XR) 150 MG 24 hr capsule  Psoriasis of scalp Patient would like to continue Effexor  as this seems to be working well for her.  While she is here today I wanted check a CBC a CMP and a lipid panel along with a vitamin D  level.  Due to this fact she has psoriasis on her face, I recommended trying Elidel  twice daily to see if this will improve the rash over the next 4 weeks.  Continue to encourage smoking cessation.

## 2023-09-02 LAB — LIPID PANEL
Cholesterol: 217 mg/dL — ABNORMAL HIGH (ref <200)
HDL: 62 mg/dL (ref >=50)
LDL Cholesterol (Calc): 136 mg/dL — ABNORMAL HIGH
Non-HDL Cholesterol (Calc): 155 mg/dL — ABNORMAL HIGH (ref <130)
Total CHOL/HDL Ratio: 3.5 (calc) (ref <5.0)
Triglycerides: 86 mg/dL (ref <150)

## 2023-09-02 LAB — COMPLETE METABOLIC PANEL WITH GFR
AG Ratio: 1.6 (calc) (ref 1.0–2.5)
ALT: 10 U/L (ref 6–29)
AST: 14 U/L (ref 10–35)
Albumin: 4.4 g/dL (ref 3.6–5.1)
Alkaline phosphatase (APISO): 80 U/L (ref 31–125)
BUN: 13 mg/dL (ref 7–25)
CO2: 23 mmol/L (ref 20–32)
Calcium: 9.6 mg/dL (ref 8.6–10.2)
Chloride: 105 mmol/L (ref 98–110)
Creat: 0.8 mg/dL (ref 0.50–0.99)
Globulin: 2.7 g/dL (ref 1.9–3.7)
Glucose, Bld: 78 mg/dL (ref 65–99)
Potassium: 4.4 mmol/L (ref 3.5–5.3)
Sodium: 138 mmol/L (ref 135–146)
Total Bilirubin: 0.5 mg/dL (ref 0.2–1.2)
Total Protein: 7.1 g/dL (ref 6.1–8.1)
eGFR: 92 mL/min/{1.73_m2} (ref >=60)

## 2023-09-02 LAB — CBC WITH DIFFERENTIAL/PLATELET
Absolute Lymphocytes: 1638 {cells}/uL (ref 850–3900)
Absolute Monocytes: 448 {cells}/uL (ref 200–950)
Basophils Absolute: 63 {cells}/uL (ref 0–200)
Basophils Relative: 0.9 %
Eosinophils Absolute: 119 {cells}/uL (ref 15–500)
Eosinophils Relative: 1.7 %
HCT: 44 % (ref 35.0–45.0)
Hemoglobin: 14.2 g/dL (ref 11.7–15.5)
MCH: 30 pg (ref 27.0–33.0)
MCHC: 32.3 g/dL (ref 32.0–36.0)
MCV: 93 fL (ref 80.0–100.0)
MPV: 10.2 fL (ref 7.5–12.5)
Monocytes Relative: 6.4 %
Neutro Abs: 4732 {cells}/uL (ref 1500–7800)
Neutrophils Relative %: 67.6 %
Platelets: 288 10*3/uL (ref 140–400)
RBC: 4.73 10*6/uL (ref 3.80–5.10)
RDW: 12.9 % (ref 11.0–15.0)
Total Lymphocyte: 23.4 %
WBC: 7 10*3/uL (ref 3.8–10.8)

## 2023-09-02 LAB — VITAMIN D 25 HYDROXY (VIT D DEFICIENCY, FRACTURES): Vit D, 25-Hydroxy: 40 ng/mL (ref 30–100)

## 2023-09-05 ENCOUNTER — Ambulatory Visit: Payer: 59 | Admitting: Family Medicine

## 2023-09-07 ENCOUNTER — Encounter: Payer: Self-pay | Admitting: Obstetrics & Gynecology

## 2023-09-08 LAB — CYTOLOGY - PAP
Chlamydia: NEGATIVE
Comment: NEGATIVE
Comment: NEGATIVE
Comment: NEGATIVE
Comment: NEGATIVE
Comment: NEGATIVE
Comment: NORMAL
Diagnosis: UNDETERMINED — AB
HPV 16: NEGATIVE
HPV 18 / 45: NEGATIVE
High risk HPV: POSITIVE — AB
Neisseria Gonorrhea: NEGATIVE
Trichomonas: NEGATIVE

## 2023-09-18 ENCOUNTER — Encounter: Payer: Self-pay | Admitting: Obstetrics & Gynecology

## 2023-09-19 ENCOUNTER — Telehealth: Payer: Self-pay

## 2023-09-19 NOTE — Telephone Encounter (Signed)
Called patient to inform her of abnormal pap results. Left message for patient to call the office back. Ashlea Dusing l Pallas Wahlert, CMA

## 2023-09-21 ENCOUNTER — Telehealth: Payer: Self-pay | Admitting: Obstetrics & Gynecology

## 2023-09-21 NOTE — Telephone Encounter (Signed)
TC to pt. I have reviewed all of her PAPs since 2018 and recommend a LEEP for persistent cervical dysplasia. Pt concurs. She has a new partner and is considering POSSIBLY attempting a pregnancy. We discussed the risks vs benefits of LEEP with her current dx. If she does get pregnant, we would checked her cervical length with [place].   All of her questions were answered.   Shelby Anderle L. Harraway-Smith, M.D., [facility]

## 2023-10-05 ENCOUNTER — Ambulatory Visit: Payer: 59 | Admitting: Obstetrics & Gynecology

## 2023-10-07 ENCOUNTER — Ambulatory Visit: Payer: 59 | Admitting: Obstetrics and Gynecology

## 2023-10-07 VITALS — BP 112/73 | HR 78 | Wt 130.0 lb

## 2023-10-07 DIAGNOSIS — N9089 Other specified noninflammatory disorders of vulva and perineum: Secondary | ICD-10-CM

## 2023-10-07 DIAGNOSIS — R87612 Low grade squamous intraepithelial lesion on cytologic smear of cervix (LGSIL): Secondary | ICD-10-CM | POA: Diagnosis not present

## 2023-10-07 NOTE — Progress Notes (Signed)
 GYNECOLOGY VISIT  Patient name: Angela [name] MRN [id number]  Date of birth: 28-May-1976 Chief Complaint:   No chief complaint on file.   History:  Angela [name] is a 48 y.o. G0P0000 being seen today for vulvar lesions.  Discussed the use of AI scribe software for clinical note transcription with the patient, who gave verbal consent to proceed.  History of Present Illness   Angela [name] is a 48 year old female with HPV who presents with lesions between the vagina and rectum.  She has noticed lesions between the vagina and rectum, resembling warts, which appeared shortly after her recent physical examination. No pain, redness, or discharge is associated with the lesions. She first noticed them a couple of weeks ago after a bowel movement when she saw blood upon wiping. One lesion appeared elongated and different from the others but has since disappeared. There has been no change in skin color or friability.  She mentions experiencing some hemorrhoids but has not had issues with constipation. She describes herself as 'pretty regular' and is unsure if straining in another way might have caused the hemorrhoids. No pain in the area when touched.  She has not had any new sexual partners since before her last physical examination. She uses toilet paper for bathroom hygiene and does not use wet wipes. She has previously used metronidazole for bacterial infections due to her pH balance being easily disrupted. She does not douche and has not experienced any unusual discharge since resolving her last bacterial infection.         Past Medical History:  Diagnosis Date   Allergy    latex and codeine   Anxiety    on lexapro   Heavy smoker (more than 20 cigarettes per day) 08/06/2011   Upper respiratory tract infection 08/27/2020   Vaginal Pap smear, abnormal    LGSIL with HPV 2019 -colpo done    No past surgical history on file.  The following portions of the  patient's history were reviewed and updated as appropriate: allergies, current medications, past family history, past medical history, past social history, past surgical history and problem list.   Health Maintenance:   Last pap     Component Value Date/Time   DIAGPAP (A) 08/31/2023 1601    - Atypical squamous cells of undetermined significance (ASC-US)   DIAGPAP - Low grade squamous intraepithelial lesion (LSIL) (A) 08/18/2022 1511   DIAGPAP - Low grade squamous intraepithelial lesion (LSIL) (A) 06/29/2021 0906   HPVHIGH Positive (A) 08/31/2023 1601   HPVHIGH Positive (A) 08/18/2022 1511   HPVHIGH Positive (A) 06/29/2021 0906   ADEQPAP  08/31/2023 1601    Satisfactory for evaluation; transformation zone component PRESENT.   ADEQPAP  08/18/2022 1511    Satisfactory for evaluation; transformation zone component PRESENT.   ADEQPAP  06/29/2021 0906    Satisfactory for evaluation; transformation zone component PRESENT.    High Risk HPV: Positive  Adequacy:  Satisfactory for evaluation, transformation zone component PRESENT  Diagnosis:  Atypical squamous cells of undetermined significance (ASC-US)  Last mammogram: 06/2023 BIRADS 1   Review of Systems:  Pertinent items are noted in HPI. Comprehensive review of systems was otherwise negative.   Objective:  Physical Exam BP 112/73 (BP Location: Left Arm, Patient Position: Sitting, Cuff Size: Normal)   Pulse 78   Wt 130 lb (59 kg)   LMP 09/14/2023 (Exact Date)   BMI 24.56 kg/m    Physical Exam Vitals and nursing note reviewed. Exam  conducted with a chaperone present.  Constitutional:      Appearance: Normal appearance.  HENT:     Head: Normocephalic and atraumatic.  Pulmonary:     Effort: Pulmonary effort is normal.     Breath sounds: Normal breath sounds.  Genitourinary:    General: Normal vulva.     Exam position: Lithotomy position.     Vagina: Normal.     Cervix: Normal.       Comments: 1mm verrucous like lesions  along upper anus, nontender and flesh colored, no abnormal discharge Possible small hemorrhoidal tag but otherwise normal appearing anus Normal perineum and vaginal introitus Skin:    General: Skin is warm and dry.  Neurological:     General: No focal deficit present.     Mental Status: She is alert.  Psychiatric:        Mood and Affect: Mood normal.        Behavior: Behavior normal.        Thought Content: Thought content normal.        Judgment: Judgment normal.         Assessment & Plan:   Assessment and Plan    Perianal Lesions Flesh-colored, non-painful, non-bleeding lesions noted between vagina and rectum. No associated redness or discharge. Lesions are new in appearance and one has resolved on its own. Differential includes warts vs benign skin changes. -Advise patient to monitor for changes in size, color, discomfort, or bleeding.  Hemorrhoids Patient reports possible hemorrhoids, with a hemorrhoid tag noted on examination. No current issues with constipation reported. -Advise patient to monitor for changes and to report any discomfort, bleeding, or changes in bowel habits.  HPV and Colposcopy Follow-up Patient has a history of HPV and has been recommended a colposcopy. She is currently working on financial assistance for this. -Advise patient to continue pursuing financial assistance and to schedule colposcopy when able.  Sexual Health Patient inquired about safety of sexual intercourse given current perianal lesions. -Advise patient that intercourse should be safe with usual precautions for STI prevention, including condom use and partner testing.       Routine preventative health maintenance measures emphasized.  Lorriane Shire, MD [facility], [facility]

## 2023-10-10 ENCOUNTER — Telehealth: Payer: Self-pay

## 2023-10-10 NOTE — Telephone Encounter (Signed)
 Spoke with patient regarding her LEEP. She said she would reach back out once she hears back from financial assistance.

## 2024-01-09 ENCOUNTER — Ambulatory Visit: Payer: Self-pay

## 2024-01-09 NOTE — Telephone Encounter (Signed)
 Copied from CRM [id number]. Topic: Clinical - Red Word Triage >> Jan 09, 2024  1:01 PM Elle L wrote: Red Word that prompted transfer to Nurse Triage: The patient has been having pain in her hip that is worsening and states that she needs an x-ray.   Chief Complaint: Hip pain Symptoms: Right hip pain, right back pain Pertinent Negatives: Patient denies any other symptoms at this time Disposition: [] ED /[x] Urgent Care (no appt availability in office) / [] Appointment(In office/virtual)/ []  [facility]/ [] Home Care/ [] Refused Recommended Disposition /[] [facility]/ []  Follow-up with PCP Additional Notes: Patient reports she has been experiencing right hip and right lower back pain for 1-2 months. She states her pain is intermittent and dull, but has been worsening recently. Patient advised of appointment availability and states she would not be able to come in this week due to work. Patient advised to go to urgent care for evaluation and treatment, which she verbalized understanding of.   Patient would like to be notified if an appointment opens up next week so that she could be seen in the office. Please advise.    Reason for Disposition  [1] MODERATE pain (e.g., interferes with normal activities, limping) AND [2] present > 3 days  Answer Assessment - Initial Assessment Questions 1. LOCATION and RADIATION: "Where is the pain located?"      Right hip 2. QUALITY: "What does the pain feel like?"  (e.g., sharp, dull, aching, burning)     Dull 3. SEVERITY: "How bad is the pain?" "What does it keep you from doing?"   (Scale 1-10; or mild, moderate, severe)   -  MILD (1-3): doesn't interfere with normal activities    -  MODERATE (4-7): interferes with normal activities (e.g., work or school) or awakens from sleep, limping    -  SEVERE (8-10): excruciating pain, unable to do any normal activities, unable to walk     Mild to moderate  4. ONSET: "When did the pain start?" "Does it  come and go, or is it there all the time?"     1-2 months ago  5. WORK OR EXERCISE: "Has there been any recent work or exercise that involved this part of the body?"      No 6. CAUSE: "What do you think is causing the hip pain?"      Unsure  7. AGGRAVATING FACTORS: "What makes the hip pain worse?" (e.g., walking, climbing stairs, running)     Standing exacerbates pain  8. OTHER SYMPTOMS: "Do you have any other symptoms?" (e.g., back pain, pain shooting down leg,  fever, rash)     Right lower back pain  Protocols used: Hip Pain-A-AH

## 2024-01-11 ENCOUNTER — Telehealth: Payer: Self-pay

## 2024-01-11 NOTE — Telephone Encounter (Signed)
 Copied from CRM [id number]. Topic: Appointments - Scheduling Inquiry for Clinic >> Jan 11, 2024  4:04 PM Baldomero Bone wrote: Reason for CRM: Patient is trying to find out how long the appointment will take with Yolanda Hence on 01/17/2024. Will x-rays be ordered, and if so, will they be done onsight same day or at a different facility? Callback number is [phone]

## 2024-01-17 ENCOUNTER — Ambulatory Visit: Admitting: Family Medicine

## 2024-01-17 ENCOUNTER — Encounter: Payer: Self-pay | Admitting: Family Medicine

## 2024-01-17 ENCOUNTER — Ambulatory Visit
Admission: RE | Admit: 2024-01-17 | Discharge: 2024-01-17 | Disposition: A | Discharge status: Home / Self Care | Source: Ambulatory Visit | Attending: Family Medicine | Admitting: Family Medicine

## 2024-01-17 VITALS — BP 102/75 | HR 96 | Ht 61.0 in | Wt 130.0 lb

## 2024-01-17 DIAGNOSIS — M5441 Lumbago with sciatica, right side: Secondary | ICD-10-CM

## 2024-01-17 DIAGNOSIS — G8929 Other chronic pain: Secondary | ICD-10-CM

## 2024-01-17 NOTE — Assessment & Plan Note (Signed)
 No red flags on exam, will obtain lumbar and hip x-rays. Encouraged to use Ibuprofen PRN and continue rest and ice/heat. Follow up as indicated and PRN

## 2024-01-17 NOTE — Progress Notes (Signed)
 Subjective:  HPI: Angela [name] is a 48 y.o. female presenting on 01/17/2024 for Pain Management (Rt Hip and Back Pain x3 mo that is tender and achy, causing pain when sitting. Muscle relaxers not helping )   HPI Patient is in today for right hip and low back pain for 3 months described as tender and aching. Was seen at urgent care and diagnosed with psoas muscle on left side. No trauma or injury. This pain radiates from her low back around to her groin. She does sit all day for work. It is worse when lying on that side or bending to her left. Does not radiate down her leg, no numbness, tingling, or weakness of lower extremity. No saddle numbness or incontinence of urine or stool. Denies rash, redness, swelling. Has tried Ibuprofen, ice, and heat.   Review of Systems  All other systems reviewed and are negative.   Relevant past medical history reviewed and updated as indicated.   Past Medical History:  Diagnosis Date   Allergy    latex and codeine   Anxiety    on lexapro    Heavy smoker (more than 20 cigarettes per day) 08/06/2011   Upper respiratory tract infection 08/27/2020   Vaginal Pap smear, abnormal    LGSIL with HPV 2019 -colpo done     History reviewed. No pertinent surgical history.  Allergies and medications reviewed and updated.   Current Outpatient Medications:    fluticasone  (FLONASE ) 50 MCG/ACT nasal spray, Place 2 sprays into both nostrils daily., Disp: 16 g, Rfl: 6   levocetirizine (XYZAL  ALLERGY 24HR) 5 MG tablet, Take 1 tablet (5 mg total) by mouth every evening., Disp: 30 tablet, Rfl: 11   venlafaxine  XR (EFFEXOR -XR) 150 MG 24 hr capsule, Take 1 capsule (150 mg total) by mouth daily with breakfast., Disp: 90 capsule, Rfl: 3   Vitamin D , Cholecalciferol , 25 MCG (1000 UT) CAPS, Take 1 capsule by mouth daily, Disp: 90 capsule, Rfl: 1   azithromycin  (ZITHROMAX ) 250 MG tablet, 2 tabs poqday1, 1 tab poqday 2-5 (Patient not taking: Reported on 01/17/2024),  Disp: 6 tablet, Rfl: 0   Cholecalciferol  (DIALYVITE VITAMIN D  5000 PO), Take by mouth. (Patient not taking: Reported on 01/17/2024), Disp: , Rfl:    metroNIDAZOLE  (METROGEL ) 0.75 % vaginal gel, Place 1 Applicatorful vaginally at bedtime. Apply one applicatorful to vagina at bedtime for 10 days, then twice a week for 6 months. (Patient not taking: Reported on 01/17/2024), Disp: 70 g, Rfl: 5   pimecrolimus  (ELIDEL ) 1 % cream, Apply topically 2 (two) times daily. (Patient not taking: Reported on 01/17/2024), Disp: 30 g, Rfl: 11   tiZANidine  (ZANAFLEX ) 4 MG capsule, Take 1 capsule (4 mg total) by mouth 3 (three) times daily as needed for muscle spasms. Do not drink alcohol or drive while taking this medication.  May cause drowsiness. (Patient not taking: Reported on 09/01/2023), Disp: 15 capsule, Rfl: 0  Allergies  Allergen Reactions   Latex    Augmentin  [Amoxicillin -Pot Clavulanate]     GI Upset   Codeine     Objective:   BP 102/75   Pulse 96   Ht 5\' 1"  (1.549 m)   Wt 130 lb 0.3 oz (59 kg)   LMP 01/09/2024   SpO2 98%   BMI 24.57 kg/m      01/17/2024   11:55 AM 10/07/2023    9:12 AM 09/01/2023    9:14 AM  Vitals with BMI  Height 5\' 1"   5\' 1"   Weight  130 lbs 130 lbs 132 lbs  BMI 24.58  24.95  Systolic 102 112 161  Diastolic 75 73 74  Pulse 96 78 100     Physical Exam Vitals and nursing note reviewed.  Constitutional:      Appearance: Normal appearance. She is normal weight.  HENT:     Head: Normocephalic and atraumatic.  Musculoskeletal:     Lumbar back: Bony tenderness present. Negative right straight leg raise test and negative left straight leg raise test.     Right hip: Tenderness present.     Left hip: Normal.  Skin:    General: Skin is warm and dry.  Neurological:     General: No focal deficit present.     Mental Status: She is alert and oriented to person, place, and time. Mental status is at baseline.  Psychiatric:        Mood and Affect: Mood normal.         Behavior: Behavior normal.        Thought Content: Thought content normal.        Judgment: Judgment normal.     Assessment & Plan:  Chronic right-sided low back pain with right-sided sciatica Assessment & Plan: No red flags on exam, will obtain lumbar and hip x-rays. Encouraged to use Ibuprofen PRN and continue rest and ice/heat. Follow up as indicated and PRN  Orders: -     DG HIP UNILAT W OR W/O PELVIS 2-3 VIEWS RIGHT; Future -     DG Lumbar Spine Complete; Future     Follow up plan: Return if symptoms worsen or fail to improve.  Jenelle Mis, FNP

## 2024-01-26 ENCOUNTER — Ambulatory Visit: Payer: Self-pay | Admitting: Family Medicine

## 2024-03-01 ENCOUNTER — Encounter: Payer: Self-pay | Admitting: Family Medicine

## 2024-03-01 ENCOUNTER — Ambulatory Visit: Payer: 59 | Admitting: Family Medicine

## 2024-03-01 VITALS — BP 118/80 | HR 98 | Temp 98.9°F | Ht 61.0 in | Wt 131.1 lb

## 2024-03-01 DIAGNOSIS — F411 Generalized anxiety disorder: Secondary | ICD-10-CM | POA: Diagnosis not present

## 2024-03-01 DIAGNOSIS — E7800 Pure hypercholesterolemia, unspecified: Secondary | ICD-10-CM | POA: Diagnosis not present

## 2024-03-01 DIAGNOSIS — E559 Vitamin D deficiency, unspecified: Secondary | ICD-10-CM

## 2024-03-01 LAB — VITAMIN D 25 HYDROXY (VIT D DEFICIENCY, FRACTURES): Vit D, 25-Hydroxy: 47 ng/mL (ref 30–100)

## 2024-03-01 MED ORDER — ZORYVE 0.3 % EX CREA: 1.0000 | TOPICAL_CREAM | Freq: Every day | CUTANEOUS | 0 refills | Status: AC

## 2024-03-01 NOTE — Progress Notes (Signed)
 Subjective:    Patient ID: Angela [name], female    DOB: [birth year], 48 y.o.   MRN: [id number]  Patient has a history of generalized anxiety disorder.  At her last office visit, her cholesterol was elevated.  I am concerned because of her smoking.  However she has made drastic changes to try to change her diet.  She is eating more fruits and vegetables.  She is avoiding pork and red meat.  Unfortunately she continues to smoke.  She is here today to recheck her cholesterol Past Medical History:  Diagnosis Date   Allergy    latex and codeine   Anxiety    on lexapro    Heavy smoker (more than 20 cigarettes per day) 08/06/2011   Upper respiratory tract infection 08/27/2020   Vaginal Pap smear, abnormal    LGSIL with HPV 2019 -colpo done   No past surgical history on file. Current Outpatient Medications on File Prior to Visit  Medication Sig Dispense Refill   azithromycin  (ZITHROMAX ) 250 MG tablet 2 tabs poqday1, 1 tab poqday 2-5 (Patient not taking: Reported on 01/17/2024) 6 tablet 0   Cholecalciferol  (DIALYVITE VITAMIN D  5000 PO) Take by mouth. (Patient not taking: Reported on 01/17/2024)     fluticasone  (FLONASE ) 50 MCG/ACT nasal spray Place 2 sprays into both nostrils daily. 16 g 6   levocetirizine (XYZAL  ALLERGY 24HR) 5 MG tablet Take 1 tablet (5 mg total) by mouth every evening. 30 tablet 11   metroNIDAZOLE  (METROGEL ) 0.75 % vaginal gel Place 1 Applicatorful vaginally at bedtime. Apply one applicatorful to vagina at bedtime for 10 days, then twice a week for 6 months. (Patient not taking: Reported on 01/17/2024) 70 g 5   pimecrolimus  (ELIDEL ) 1 % cream Apply topically 2 (two) times daily. (Patient not taking: Reported on 01/17/2024) 30 g 11   tiZANidine  (ZANAFLEX ) 4 MG capsule Take 1 capsule (4 mg total) by mouth 3 (three) times daily as needed for muscle spasms. Do not drink alcohol or drive while taking this medication.  May cause drowsiness. (Patient not taking: Reported on  09/01/2023) 15 capsule 0   venlafaxine  XR (EFFEXOR -XR) 150 MG 24 hr capsule Take 1 capsule (150 mg total) by mouth daily with breakfast. 90 capsule 3   Vitamin D , Cholecalciferol , 25 MCG (1000 UT) CAPS Take 1 capsule by mouth daily 90 capsule 1   No current facility-administered medications on file prior to visit.   Allergies  Allergen Reactions   Latex    Augmentin  [Amoxicillin -Pot Clavulanate]     GI Upset   Codeine    Social History   Socioeconomic History   Marital status: Single    Spouse name: Not on file   Number of children: Not on file   Years of education: Not on file   Highest education level: High school graduate  Occupational History   Not on file  Tobacco Use   Smoking status: Every Day    Current packs/day: 1.00    Average packs/day: 1 pack/day for 15.0 years (15.0 ttl pk-yrs)    Types: Cigarettes   Smokeless tobacco: Never   Tobacco comments:    trying to cut back and quit  Vaping Use   Vaping status: Never Used  Substance and Sexual Activity   Alcohol use: Yes    Comment: moderate   Drug use: No   Sexual activity: Yes    Birth control/protection: None  Other Topics Concern   Not on file  Social History Narrative  Not on file   Social Drivers of Health   Financial Resource Strain: Not on file  Food Insecurity: Not on file  Transportation Needs: Not on file  Physical Activity: Not on file  Stress: Not on file  Social Connections: Not on file  Intimate Partner Violence: Not on file      Review of Systems  All other systems reviewed and are negative.      Objective:   Physical Exam Constitutional:      General: She is not in acute distress.    Appearance: She is well-developed. She is not diaphoretic.  HENT:     Head:      Right Ear: External ear normal.     Left Ear: External ear normal.     Nose: Nose normal.     Mouth/Throat:     Pharynx: No oropharyngeal exudate.  Eyes:     Conjunctiva/sclera: Conjunctivae normal.   Cardiovascular:     Rate and Rhythm: Normal rate and regular rhythm.     Heart sounds: Normal heart sounds. No murmur heard.    No friction rub. No gallop.  Pulmonary:     Effort: Pulmonary effort is normal. No respiratory distress.     Breath sounds: Normal breath sounds. No wheezing or rales.  Abdominal:     General: Bowel sounds are normal.     Palpations: Abdomen is soft.  Musculoskeletal:     Cervical back: Neck supple.  Lymphadenopathy:     Cervical: No cervical adenopathy.           Assessment & Plan:  GAD (generalized anxiety disorder)  Pure hypercholesterolemia  Vitamin D  deficiency - Plan: VITAMIN D  25 Hydroxy (Vit-D Deficiency, Fractures) Patient has a history of vitamin D  deficiency.  She would like to monitor this as well.  I will check a fasting lipid panel.  Because of her smoking I would like her LDL cholesterol to be less than 100.  Continue to encourage smoking cessation.  Patient has what appears to be eczema or seborrheic dermatitis on her nose, on her ears, and on her eyebrows.  I recommended trying zoryve  as she has tried and failed numerous topical steroids.

## 2024-03-02 ENCOUNTER — Other Ambulatory Visit (HOSPITAL_COMMUNITY): Payer: Self-pay

## 2024-03-02 ENCOUNTER — Ambulatory Visit: Payer: Self-pay | Admitting: Family Medicine

## 2024-03-02 ENCOUNTER — Telehealth: Payer: Self-pay | Admitting: Pharmacy Technician

## 2024-03-02 NOTE — Telephone Encounter (Signed)
 Pharmacy Patient Advocate Encounter   Received notification from Onbase that prior authorization for Zoryve  0.3% cream is required/requested.   Insurance verification completed.   The patient is insured through [facility] .   Per test claim: PA required; PA submitted to above mentioned insurance via CoverMyMeds Key/confirmation #/EOC [facility] Status is pending

## 2024-03-05 ENCOUNTER — Telehealth: Payer: Self-pay

## 2024-03-05 NOTE — Telephone Encounter (Signed)
 Copied from CRM [id number]. Topic: Clinical - Request for Lab/Test Order >> Mar 05, 2024 11:03 AM Delon DASEN wrote: Reason for CRM: requesting labs for  cmp and lipid- [phone]

## 2024-03-05 NOTE — Telephone Encounter (Signed)
 Sent to [facility] to add on lab

## 2024-03-05 NOTE — Telephone Encounter (Signed)
 Pharmacy Patient Advocate Encounter  Received notification from [employer] that Prior Authorization for Zoryve  0.3% cream has been DENIED.  No reason given; No denial letter received via Fax or CMM. It has been requested and will be uploaded to the media tab once received.   PA #/Case ID/Reference #:  [id number]

## 2024-03-06 ENCOUNTER — Other Ambulatory Visit

## 2024-03-06 DIAGNOSIS — F17210 Nicotine dependence, cigarettes, uncomplicated: Secondary | ICD-10-CM

## 2024-03-06 DIAGNOSIS — E7800 Pure hypercholesterolemia, unspecified: Secondary | ICD-10-CM

## 2024-03-06 LAB — COMPLETE METABOLIC PANEL WITHOUT GFR
AG Ratio: 1.8 (calc) (ref 1.0–2.5)
ALT: 10 U/L (ref 6–29)
AST: 11 U/L (ref 10–35)
Albumin: 4.4 g/dL (ref 3.6–5.1)
Alkaline phosphatase (APISO): 73 U/L (ref 31–125)
BUN: 13 mg/dL (ref 7–25)
CO2: 24 mmol/L (ref 20–32)
Calcium: 9.5 mg/dL (ref 8.6–10.2)
Chloride: 106 mmol/L (ref 98–110)
Creat: 0.7 mg/dL (ref 0.50–0.99)
Globulin: 2.5 g/dL (ref 1.9–3.7)
Glucose, Bld: 87 mg/dL (ref 65–99)
Potassium: 4.3 mmol/L (ref 3.5–5.3)
Sodium: 138 mmol/L (ref 135–146)
Total Bilirubin: 0.5 mg/dL (ref 0.2–1.2)
Total Protein: 6.9 g/dL (ref 6.1–8.1)

## 2024-03-06 LAB — CBC WITH DIFFERENTIAL/PLATELET
Absolute Lymphocytes: 1555 {cells}/uL (ref 850–3900)
Absolute Monocytes: 562 {cells}/uL (ref 200–950)
Basophils Absolute: 79 {cells}/uL (ref 0–200)
Basophils Relative: 1.1 %
Eosinophils Absolute: 137 {cells}/uL (ref 15–500)
Eosinophils Relative: 1.9 %
HCT: 43.2 % (ref 35.0–45.0)
Hemoglobin: 13.8 g/dL (ref 11.7–15.5)
MCH: 30.1 pg (ref 27.0–33.0)
MCHC: 31.9 g/dL — ABNORMAL LOW (ref 32.0–36.0)
MCV: 94.3 fL (ref 80.0–100.0)
MPV: 10.4 fL (ref 7.5–12.5)
Monocytes Relative: 7.8 %
Neutro Abs: 4867 {cells}/uL (ref 1500–7800)
Neutrophils Relative %: 67.6 %
Platelets: 282 Thousand/uL (ref 140–400)
RBC: 4.58 Million/uL (ref 3.80–5.10)
RDW: 13.2 % (ref 11.0–15.0)
Total Lymphocyte: 21.6 %
WBC: 7.2 Thousand/uL (ref 3.8–10.8)

## 2024-03-06 LAB — LIPID PANEL
Cholesterol: 219 mg/dL — ABNORMAL HIGH (ref <200)
HDL: 53 mg/dL (ref >=50)
LDL Cholesterol (Calc): 144 mg/dL — ABNORMAL HIGH
Non-HDL Cholesterol (Calc): 166 mg/dL — ABNORMAL HIGH (ref <130)
Total CHOL/HDL Ratio: 4.1 (calc) (ref <5.0)
Triglycerides: 105 mg/dL (ref <150)

## 2024-03-08 ENCOUNTER — Ambulatory Visit: Payer: Self-pay | Admitting: Family Medicine

## 2024-03-12 ENCOUNTER — Other Ambulatory Visit: Payer: Self-pay | Admitting: Family Medicine

## 2024-03-12 MED ORDER — ROSUVASTATIN CALCIUM 10 MG PO TABS: 10.0000 mg | ORAL_TABLET | Freq: Every day | ORAL | 3 refills | Status: DC

## 2024-03-12 NOTE — Telephone Encounter (Signed)
 Copied from CRM [id number]. Topic: Clinical - Medication Question >> Mar 12, 2024 12:15 PM Precious C wrote: Reason for CRM: Patient called to confirm she would like to start taking Crestor  for her cholesterol, as previously discussed with her provider. She also confirmed she is willing to take the other medication that was recommended. Patient is requesting a call back. Call back number: [phone].

## 2024-03-20 ENCOUNTER — Other Ambulatory Visit: Payer: Self-pay | Admitting: Family Medicine

## 2024-03-20 ENCOUNTER — Telehealth: Payer: Self-pay

## 2024-03-20 MED ORDER — TRIAMCINOLONE ACETONIDE 0.1 % EX CREA: 1.0000 | TOPICAL_CREAM | Freq: Two times a day (BID) | CUTANEOUS | 1 refills | Status: AC

## 2024-03-20 NOTE — Telephone Encounter (Signed)
 Copied from CRM [id number]. Topic: Clinical - Prescription Issue >> Mar 20, 2024 11:11 AM Pinkey ORN wrote: Reason for CRM: Medication Reaction >> Mar 20, 2024 11:18 AM Pinkey ORN wrote: Patient called in wanting to report an reaction she's having from rosuvastatin  (CRESTOR ) 10 MG tablet. Patient states she's going to stop taking for awhile and is wanting to know if there's another medication she can be prescribed in place of this one. Patient is also wanting to know if she's needing to upload proof / imaging and what can she use for the rash itself? Patient call back number is [phone]    Please review and advise.

## 2024-03-27 ENCOUNTER — Other Ambulatory Visit: Payer: Self-pay | Admitting: Family Medicine

## 2024-03-27 MED ORDER — ATORVASTATIN CALCIUM 20 MG PO TABS: 20.0000 mg | ORAL_TABLET | Freq: Every day | ORAL | 3 refills | Status: AC

## 2024-05-10 ENCOUNTER — Encounter: Payer: Self-pay | Admitting: Family Medicine

## 2024-06-20 ENCOUNTER — Other Ambulatory Visit: Payer: Self-pay | Admitting: Obstetrics and Gynecology

## 2024-06-20 DIAGNOSIS — Z1231 Encounter for screening mammogram for malignant neoplasm of breast: Secondary | ICD-10-CM

## 2024-07-17 ENCOUNTER — Ambulatory Visit
Admission: RE | Admit: 2024-07-17 | Discharge: 2024-07-17 | Disposition: A | Discharge status: Home / Self Care | Source: Ambulatory Visit | Attending: Obstetrics and Gynecology | Admitting: Obstetrics and Gynecology

## 2024-07-17 DIAGNOSIS — Z1231 Encounter for screening mammogram for malignant neoplasm of breast: Secondary | ICD-10-CM

## 2024-07-23 ENCOUNTER — Ambulatory Visit: Payer: Self-pay | Admitting: Obstetrics and Gynecology

## 2024-08-27 ENCOUNTER — Telehealth: Payer: Self-pay

## 2024-08-27 ENCOUNTER — Other Ambulatory Visit: Payer: Self-pay | Admitting: Family Medicine

## 2024-08-27 DIAGNOSIS — F419 Anxiety disorder, unspecified: Secondary | ICD-10-CM

## 2024-08-27 NOTE — Telephone Encounter (Signed)
 Copied from CRM [id number]. Topic: Clinical - Medication Question >> Aug 27, 2024  1:40 PM Emylou G wrote: Reason for CRM: patient wanted to call and adv cannot go w/o refill of  Venlafaxine  HCl (150 mg Oral Daily with breakfast) .SABRA I did adv her of turn time.SABRA but wanted to let us  know she is about to be out .. I did adv we did get the refill req today

## 2024-08-31 ENCOUNTER — Ambulatory Visit: Admitting: Family Medicine

## 2024-08-31 ENCOUNTER — Encounter: Payer: Self-pay | Admitting: Family Medicine

## 2024-08-31 VITALS — BP 110/62 | HR 86 | Temp 97.9°F | Ht 61.0 in | Wt 134.0 lb

## 2024-08-31 DIAGNOSIS — L409 Psoriasis, unspecified: Secondary | ICD-10-CM | POA: Diagnosis not present

## 2024-08-31 DIAGNOSIS — E559 Vitamin D deficiency, unspecified: Secondary | ICD-10-CM | POA: Diagnosis not present

## 2024-08-31 DIAGNOSIS — Z0000 Encounter for general adult medical examination without abnormal findings: Secondary | ICD-10-CM

## 2024-08-31 DIAGNOSIS — E7800 Pure hypercholesterolemia, unspecified: Secondary | ICD-10-CM

## 2024-08-31 DIAGNOSIS — Z0001 Encounter for general adult medical examination with abnormal findings: Secondary | ICD-10-CM | POA: Diagnosis not present

## 2024-08-31 DIAGNOSIS — F411 Generalized anxiety disorder: Secondary | ICD-10-CM | POA: Diagnosis not present

## 2024-08-31 LAB — COMPREHENSIVE METABOLIC PANEL WITH GFR
AG Ratio: 2.1 (calc) (ref 1.0–2.5)
ALT: 10 U/L (ref 6–29)
AST: 11 U/L (ref 10–35)
Albumin: 4.5 g/dL (ref 3.6–5.1)
Alkaline phosphatase (APISO): 74 U/L (ref 31–125)
BUN: 14 mg/dL (ref 7–25)
CO2: 26 mmol/L (ref 20–32)
Calcium: 9.2 mg/dL (ref 8.6–10.2)
Chloride: 105 mmol/L (ref 98–110)
Creat: 0.64 mg/dL (ref 0.50–0.99)
Globulin: 2.1 g/dL (ref 1.9–3.7)
Glucose, Bld: 77 mg/dL (ref 65–99)
Potassium: 4.6 mmol/L (ref 3.5–5.3)
Sodium: 139 mmol/L (ref 135–146)
Total Bilirubin: 0.5 mg/dL (ref 0.2–1.2)
Total Protein: 6.6 g/dL (ref 6.1–8.1)
eGFR: 110 mL/min/1.73m2

## 2024-08-31 LAB — CBC WITH DIFFERENTIAL/PLATELET
Absolute Lymphocytes: 1761 {cells}/uL (ref 850–3900)
Absolute Monocytes: 598 {cells}/uL (ref 200–950)
Basophils Absolute: 88 {cells}/uL (ref 0–200)
Basophils Relative: 1.3 %
Eosinophils Absolute: 184 {cells}/uL (ref 15–500)
Eosinophils Relative: 2.7 %
HCT: 42.7 % (ref 35.9–46.0)
Hemoglobin: 13.9 g/dL (ref 11.7–15.5)
MCH: 30.4 pg (ref 27.0–33.0)
MCHC: 32.6 g/dL (ref 31.6–35.4)
MCV: 93.4 fL (ref 81.4–101.7)
MPV: 10.1 fL (ref 7.5–12.5)
Monocytes Relative: 8.8 %
Neutro Abs: 4168 {cells}/uL (ref 1500–7800)
Neutrophils Relative %: 61.3 %
Platelets: 344 Thousand/uL (ref 140–400)
RBC: 4.57 Million/uL (ref 3.80–5.10)
RDW: 13 % (ref 11.0–15.0)
Total Lymphocyte: 25.9 %
WBC: 6.8 Thousand/uL (ref 3.8–10.8)

## 2024-08-31 LAB — LIPID PANEL
Cholesterol: 227 mg/dL — ABNORMAL HIGH
HDL: 55 mg/dL
LDL Cholesterol (Calc): 154 mg/dL — ABNORMAL HIGH
Non-HDL Cholesterol (Calc): 172 mg/dL — ABNORMAL HIGH
Total CHOL/HDL Ratio: 4.1 (calc)
Triglycerides: 80 mg/dL

## 2024-08-31 LAB — VITAMIN D 25 HYDROXY (VIT D DEFICIENCY, FRACTURES): Vit D, 25-Hydroxy: 49 ng/mL (ref 30–100)

## 2024-08-31 NOTE — Addendum Note (Signed)
 Addended by: ANGELENA RONAL BRADLEY K on: 08/31/2024 08:47 AM   Modules accepted: Orders

## 2024-08-31 NOTE — Progress Notes (Signed)
 "  Subjective:    Patient ID: Angela [name], female    DOB: 1975/12/03, 49 y.o.   MRN: [id number]  Patient is a 49 year old Caucasian female with a history of hyperlipidemia, psoriasis, and smoking who presents today for complete physical exam.  Her last mammogram was November 2025 and is up-to-date.  She had Cologuard in 07/2022 and is due again for colon cancer screening 07/2025.  Patient had a Pap smear in January 2025 that was significant for ASCUS with high risk HPV present.  She sees her gynecologist for this.  She has an appointment to see her gynecologist later this month.  Patient had to discontinue Crestor  this summer after she developed hives on her lower chest and upper abdomen.  She states that it was a very severe reaction.  At the present time she is treating her cholesterol with fish oil and ginseng.  She has been off of statin for several months.  Therefore her cholesterol today is a reflection of her diet and her supplements.  She continues to smoke.  She is in the precontemplative phase.  She declines a flu shot.  She is not yet due for pneumonia or shingles shot. Past Medical History:  Diagnosis Date   Allergy    latex and codeine   Anxiety    on lexapro    Heavy smoker (more than 20 cigarettes per day) 08/06/2011   Upper respiratory tract infection 08/27/2020   Vaginal Pap smear, abnormal    LGSIL with HPV 2019 -colpo done   No past surgical history on file. Current Outpatient Medications on File Prior to Visit  Medication Sig Dispense Refill   atorvastatin  (LIPITOR) 20 MG tablet Take 1 tablet (20 mg total) by mouth daily. 90 tablet 3   fluticasone  (FLONASE ) 50 MCG/ACT nasal spray Place 2 sprays into both nostrils daily. 16 g 6   levocetirizine (XYZAL  ALLERGY 24HR) 5 MG tablet Take 1 tablet (5 mg total) by mouth every evening. 30 tablet 11   metroNIDAZOLE  (METROGEL ) 0.75 % vaginal gel Place 1 Applicatorful vaginally at bedtime. Apply one applicatorful to vagina at  bedtime for 10 days, then twice a week for 6 months. (Patient not taking: Reported on 03/01/2024) 70 g 5   Roflumilast  (ZORYVE ) 0.3 % CREA Apply 1 Application topically daily. 60 g 0   tiZANidine  (ZANAFLEX ) 4 MG capsule Take 1 capsule (4 mg total) by mouth 3 (three) times daily as needed for muscle spasms. Do not drink alcohol or drive while taking this medication.  May cause drowsiness. 15 capsule 0   triamcinolone  cream (KENALOG ) 0.1 % Apply 1 Application topically 2 (two) times daily. 45 g 1   venlafaxine  XR (EFFEXOR -XR) 150 MG 24 hr capsule TAKE 1 CAPSULE BY MOUTH ONCE DAILY WITH BREAKFAST 90 capsule 0   Vitamin D , Cholecalciferol , 25 MCG (1000 UT) CAPS Take 1 capsule by mouth daily 90 capsule 1   No current facility-administered medications on file prior to visit.   Allergies  Allergen Reactions   Latex    Augmentin  [Amoxicillin -Pot Clavulanate]     GI Upset   Codeine    Crestor  [Rosuvastatin ]     rash   Social History   Socioeconomic History   Marital status: Single    Spouse name: Not on file   Number of children: Not on file   Years of education: Not on file   Highest education level: High school graduate  Occupational History   Not on file  Tobacco Use  Smoking status: Every Day    Current packs/day: 1.00    Average packs/day: 1 pack/day for 15.0 years (15.0 ttl pk-yrs)    Types: Cigarettes   Smokeless tobacco: Never   Tobacco comments:    trying to cut back and quit  Vaping Use   Vaping status: Never Used  Substance and Sexual Activity   Alcohol use: Yes    Comment: moderate   Drug use: No   Sexual activity: Yes    Birth control/protection: None  Other Topics Concern   Not on file  Social History Narrative   Not on file   Social Drivers of Health   Tobacco Use: High Risk (03/01/2024)   Patient History    Smoking Tobacco Use: Every Day    Smokeless Tobacco Use: Never    Passive Exposure: Not on file  Financial Resource Strain: Not on file  Food  Insecurity: Not on file  Transportation Needs: Not on file  Physical Activity: Not on file  Stress: Not on file  Social Connections: Not on file  Intimate Partner Violence: Not on file  Depression (PHQ2-9): Low Risk (01/17/2024)   Depression (PHQ2-9)    PHQ-2 Score: 1  Alcohol Screen: Not on file  Housing: Not on file  Utilities: Not on file  Health Literacy: Not on file      Review of Systems  All other systems reviewed and are negative.      Objective:   Physical Exam Constitutional:      General: She is not in acute distress.    Appearance: Normal appearance. She is well-developed and normal weight. She is not toxic-appearing or diaphoretic.  HENT:     Right Ear: Tympanic membrane, ear canal and external ear normal.     Left Ear: Tympanic membrane and external ear normal.     Nose: Nose normal. No congestion or rhinorrhea.     Mouth/Throat:     Pharynx: No oropharyngeal exudate.  Eyes:     Conjunctiva/sclera: Conjunctivae normal.  Neck:     Vascular: No carotid bruit.  Cardiovascular:     Rate and Rhythm: Normal rate and regular rhythm.     Heart sounds: Normal heart sounds. No murmur heard.    No friction rub. No gallop.  Pulmonary:     Effort: Pulmonary effort is normal. No respiratory distress.     Breath sounds: Normal breath sounds. No stridor. No wheezing, rhonchi or rales.  Chest:     Chest wall: No tenderness.  Abdominal:     General: Bowel sounds are normal. There is no distension.     Palpations: Abdomen is soft.     Tenderness: There is no abdominal tenderness. There is no guarding or rebound.  Musculoskeletal:     Cervical back: Neck supple. No rigidity.     Right lower leg: No edema.     Left lower leg: No edema.  Lymphadenopathy:     Cervical: No cervical adenopathy.  Skin:    Findings: Rash present. No erythema.  Neurological:     General: No focal deficit present.     Mental Status: She is alert and oriented to person, place, and time.  Mental status is at baseline.     Cranial Nerves: No cranial nerve deficit.     Motor: No weakness.     Coordination: Coordination normal.     Gait: Gait normal.     Deep Tendon Reflexes: Reflexes normal.  Psychiatric:        Mood  and Affect: Mood normal.        Behavior: Behavior normal.        Thought Content: Thought content normal.        Judgment: Judgment normal.    Patient psoriasis looks much better on her elbows       Assessment & Plan:  General medical exam - Plan: CBC with Differential/Platelet, Comprehensive metabolic panel with GFR, Lipid panel  GAD (generalized anxiety disorder)  Pure hypercholesterolemia - Plan: CBC with Differential/Platelet, Comprehensive metabolic panel with GFR, Lipid panel  Psoriasis of scalp I continue to encourage the patient to stop smoking.  She is due for colon cancer testing in December of this year.  She will follow-up with her gynecologist regarding her Pap smear.  Her mammogram is up-to-date.  She declines a flu shot.  We will check her cholesterol.  I would like to see her LDL cholesterol less than 869 because of her smoking and ideally less than 100.  The patient does not want to take a statin due to her previous reaction to rosuvastatin .  We discussed possibly using Zetia  if necessary and she is interested in that.  Her blood pressure today is outstanding. "

## 2024-09-03 ENCOUNTER — Ambulatory Visit: Payer: Self-pay | Admitting: Family Medicine

## 2024-09-03 ENCOUNTER — Other Ambulatory Visit: Payer: Self-pay

## 2024-09-03 MED ORDER — EZETIMIBE 10 MG PO TABS: 10.0000 mg | ORAL_TABLET | Freq: Every day | ORAL | 1 refills | Status: AC

## 2024-09-12 ENCOUNTER — Ambulatory Visit: Admitting: Obstetrics and Gynecology

## 2024-09-17 ENCOUNTER — Ambulatory Visit: Admitting: Obstetrics and Gynecology

## 2024-09-25 ENCOUNTER — Telehealth: Admitting: Family Medicine

## 2024-09-25 ENCOUNTER — Encounter: Payer: Self-pay | Admitting: Family Medicine

## 2024-09-25 VITALS — BP 110/60 | HR 89 | Temp 97.9°F | Ht 61.0 in | Wt 132.8 lb

## 2024-09-25 DIAGNOSIS — H5789 Other specified disorders of eye and adnexa: Secondary | ICD-10-CM

## 2024-09-25 MED ORDER — POLYMYXIN B-TRIMETHOPRIM 10000-0.1 UNIT/ML-% OP SOLN: 2.0000 [drp] | Freq: Four times a day (QID) | OPHTHALMIC | 0 refills | Status: AC

## 2024-10-15 ENCOUNTER — Ambulatory Visit: Admitting: Obstetrics and Gynecology

## 2025-09-06 ENCOUNTER — Encounter: Admitting: Family Medicine
